# Patient Record
Sex: Female | Born: 1990 | Hispanic: Yes | Marital: Single | Smoking: Never smoker
Health system: Southern US, Community
[De-identification: ages and names within clinical notes are randomized; demographics above are authoritative.]

## PROBLEM LIST (undated history)

## (undated) DIAGNOSIS — N10 Acute tubulo-interstitial nephritis: Secondary | ICD-10-CM

## (undated) DIAGNOSIS — O139 Gestational [pregnancy-induced] hypertension without significant proteinuria, unspecified trimester: Secondary | ICD-10-CM

## (undated) HISTORY — PX: NO PAST SURGERIES: SHX2092

---

## 2009-08-20 ENCOUNTER — Ambulatory Visit (HOSPITAL_COMMUNITY): Admission: RE | Admit: 2009-08-20 | Discharge: 2009-08-20 | Payer: Self-pay | Admitting: Family Medicine

## 2010-04-25 NOTE — L&D Delivery Note (Signed)
.  Delivery Note At 10:23 AM a viable female was delivered via Vaginal, Spontaneous Delivery (Presentation: ; Occiput Anterior).  APGAR: 7, 9; weight 6 lb 3.8 oz (2830 g).   Placenta status: delivered intact , .  Cord: 3 vessels with the following complications: .  Cord pH: is pending.  Was a venous sample as an arterial sample could not be obtained.  Anesthesia: Epidural Local perineal with 1% lidocaine without epi Episiotomy: None Lacerations: 1st degree;Perineal Suture Repair: none Est. Blood Loss (mL):  Mom to AICU.  Baby to nursery-stable.  Cylan Borum 12/08/2010, 10:45 AM

## 2010-05-16 ENCOUNTER — Encounter: Payer: Self-pay | Admitting: Family Medicine

## 2010-07-25 ENCOUNTER — Inpatient Hospital Stay (HOSPITAL_COMMUNITY): Admission: AD | Admit: 2010-07-25 | Payer: Self-pay | Source: Ambulatory Visit | Admitting: Obstetrics & Gynecology

## 2010-07-26 ENCOUNTER — Emergency Department (HOSPITAL_COMMUNITY)
Admission: EM | Admit: 2010-07-26 | Discharge: 2010-07-26 | Disposition: A | Discharge status: Home / Self Care | Payer: Medicaid Other | Attending: Emergency Medicine | Admitting: Emergency Medicine

## 2010-07-26 ENCOUNTER — Emergency Department (HOSPITAL_COMMUNITY): Payer: Self-pay

## 2010-07-26 ENCOUNTER — Emergency Department (HOSPITAL_COMMUNITY): Payer: Medicaid Other

## 2010-07-26 DIAGNOSIS — R109 Unspecified abdominal pain: Secondary | ICD-10-CM | POA: Insufficient documentation

## 2010-07-26 DIAGNOSIS — N12 Tubulo-interstitial nephritis, not specified as acute or chronic: Secondary | ICD-10-CM | POA: Insufficient documentation

## 2010-07-26 DIAGNOSIS — R112 Nausea with vomiting, unspecified: Secondary | ICD-10-CM | POA: Insufficient documentation

## 2010-07-26 DIAGNOSIS — R509 Fever, unspecified: Secondary | ICD-10-CM | POA: Insufficient documentation

## 2010-07-26 DIAGNOSIS — N898 Other specified noninflammatory disorders of vagina: Secondary | ICD-10-CM | POA: Insufficient documentation

## 2010-07-26 DIAGNOSIS — O2390 Unspecified genitourinary tract infection in pregnancy, unspecified trimester: Secondary | ICD-10-CM | POA: Insufficient documentation

## 2010-07-26 LAB — URINALYSIS, ROUTINE W REFLEX MICROSCOPIC
Glucose, UA: NEGATIVE mg/dL
Specific Gravity, Urine: 1.016 (ref 1.005–1.030)
pH: 7 (ref 5.0–8.0)

## 2010-07-26 LAB — WET PREP, GENITAL: Trich, Wet Prep: NONE SEEN

## 2010-07-26 LAB — CBC
MCHC: 35.2 g/dL (ref 30.0–36.0)
MCV: 84.3 fL (ref 78.0–100.0)
Platelets: 370 10*3/uL (ref 150–400)
RDW: 12.2 % (ref 11.5–15.5)
WBC: 12.8 10*3/uL — ABNORMAL HIGH (ref 4.0–10.5)

## 2010-07-26 LAB — DIFFERENTIAL
Basophils Absolute: 0 10*3/uL (ref 0.0–0.1)
Eosinophils Absolute: 0.1 10*3/uL (ref 0.0–0.7)
Eosinophils Relative: 0 % (ref 0–5)

## 2010-07-26 LAB — URINE MICROSCOPIC-ADD ON

## 2010-07-27 LAB — URINE CULTURE
Colony Count: >=100000
Culture  Setup Time: 201204022254

## 2010-07-27 LAB — GC/CHLAMYDIA PROBE AMP, GENITAL: Chlamydia, DNA Probe: NEGATIVE

## 2010-08-11 ENCOUNTER — Other Ambulatory Visit: Payer: Self-pay | Admitting: Family Medicine

## 2010-08-11 DIAGNOSIS — Z3689 Encounter for other specified antenatal screening: Secondary | ICD-10-CM

## 2010-08-11 LAB — HEPATITIS B SURFACE ANTIGEN: Hepatitis B Surface Ag: NEGATIVE

## 2010-08-11 LAB — ANTIBODY SCREEN: Antibody Screen: NEGATIVE

## 2010-08-11 LAB — ABO/RH: RH Type: POSITIVE

## 2010-08-16 ENCOUNTER — Ambulatory Visit (HOSPITAL_COMMUNITY)
Admission: RE | Admit: 2010-08-16 | Discharge: 2010-08-16 | Disposition: A | Discharge status: Home / Self Care | Payer: Medicaid Other | Source: Ambulatory Visit | Attending: Family Medicine | Admitting: Family Medicine

## 2010-08-16 DIAGNOSIS — Z3689 Encounter for other specified antenatal screening: Secondary | ICD-10-CM

## 2010-08-16 DIAGNOSIS — O358XX0 Maternal care for other (suspected) fetal abnormality and damage, not applicable or unspecified: Secondary | ICD-10-CM | POA: Insufficient documentation

## 2010-08-16 DIAGNOSIS — Z363 Encounter for antenatal screening for malformations: Secondary | ICD-10-CM | POA: Insufficient documentation

## 2010-08-16 DIAGNOSIS — Z1389 Encounter for screening for other disorder: Secondary | ICD-10-CM | POA: Insufficient documentation

## 2010-10-21 ENCOUNTER — Inpatient Hospital Stay (HOSPITAL_COMMUNITY): Payer: BC Managed Care – PPO

## 2010-10-21 ENCOUNTER — Inpatient Hospital Stay (HOSPITAL_COMMUNITY)
Admission: AD | Admit: 2010-10-21 | Discharge: 2010-10-23 | DRG: 886 | Disposition: A | Discharge status: Home / Self Care | Payer: BC Managed Care – PPO | Source: Ambulatory Visit | Attending: Family Medicine | Admitting: Family Medicine

## 2010-10-21 DIAGNOSIS — O26839 Pregnancy related renal disease, unspecified trimester: Secondary | ICD-10-CM

## 2010-10-21 DIAGNOSIS — N1330 Unspecified hydronephrosis: Secondary | ICD-10-CM

## 2010-10-21 DIAGNOSIS — R109 Unspecified abdominal pain: Secondary | ICD-10-CM | POA: Diagnosis present

## 2010-10-21 LAB — URINALYSIS, ROUTINE W REFLEX MICROSCOPIC
Bilirubin Urine: NEGATIVE
Ketones, ur: NEGATIVE mg/dL
Nitrite: NEGATIVE
pH: 6.5 (ref 5.0–8.0)

## 2010-10-21 LAB — CBC
HCT: 31.3 % — ABNORMAL LOW (ref 36.0–46.0)
Hemoglobin: 10.8 g/dL — ABNORMAL LOW (ref 12.0–15.0)
MCHC: 34.5 g/dL (ref 30.0–36.0)
RBC: 3.61 MIL/uL — ABNORMAL LOW (ref 3.87–5.11)
WBC: 12.6 10*3/uL — ABNORMAL HIGH (ref 4.0–10.5)

## 2010-10-21 LAB — URINE MICROSCOPIC-ADD ON

## 2010-10-25 NOTE — Discharge Summary (Signed)
  Amanda Sharp, Amanda Sharp         ACCOUNT NO.:  [id number]  MEDICAL RECORD NO.:  [id number]  LOCATION:  9157                          FACILITY:  [facility]  PHYSICIAN:  Catalina Antigua, MD     DATE OF BIRTH:  1991/04/18  DATE OF ADMISSION:  10/21/2010 DATE OF DISCHARGE:  10/23/2010                              DISCHARGE SUMMARY   This is a 20 year old G1 who presents at 31 weeks in 5 days with a history of right flank pain.  On admission, her white count was 12.6.  Her vital signs were stable and afebrile.  She had a renal ultrasound which showed moderate right hydronephrosis and no right ureteral jets was visualized, raising concern for possible ureteral stone and obstruction.  Urology consult was obtained.  The patient was managed with IV antibiotics and pain medication.  On hospital day #1, Urology consult was obtained at which point the patient's pain is significantly improved.  Urology recommended to continue oral antibiotics and followup analgesics as needed and followup as an outpatient postpartum or if the pain persisted that the patient needed to have a double J stent placed.  On hospital #2, the patient reported complete resolution of her pain.  The patient continued to remain afebrile and did not require any pain medications for greater than 24-hour period.  Fetal status remained reassuring without her hospitalization.  The patient was found to be stable for discharge.  Prescription for 20 tablets of Percocet was given p.r.n. for pain.  Discharge instructions were also provided.  Fetal movement and preterm labor precautions were given.  The patient is to follow up for her routine prenatal care on July 9,  at the [facility] as scheduled. The patient was also instructed to return to the MAU if she develops fevers or her pain worsens.     Catalina Antigua, MD     PC/MEDQ  D:  10/23/2010  T:  10/23/2010  Job:  [id number]  Electronically Signed by Catalina Antigua  on  10/25/2010 02:30:03 PM

## 2010-11-18 NOTE — Consult Note (Signed)
  Amanda Sharp, Amanda Sharp         ACCOUNT NO.:  [id number]  MEDICAL RECORD NO.:  [id number]  LOCATION:  9157                          FACILITY:  [facility]  PHYSICIAN:  Danae Chen, M.D.  DATE OF BIRTH:  November 16, 1990  DATE OF CONSULTATION:  10/22/2010 DATE OF DISCHARGE:                                CONSULTATION   REASON FOR CONSULTATION:  Right flank pain and moderate hydronephrosis.  The patient is a 20 year old female who is 84 weeks' pregnant.  She had a sudden onset of severe right flank pain associated with nausea and vomiting.  A renal ultrasound showed moderate right hydronephrosis and no right ureteral jet within the bladder.  She was admitted and treated with analgesics.  This morning she feels a little bit better.  She does not have as much pain but she still has nausea.  She denies any previous history of kidney stone.  She is on nitrofurantoin for suppressive antibiotic therapy for urinary tract infection.  She voids well at this time.  She denies frequency, urgency, dysuria or hematuria.  PAST MEDICAL HISTORY:  Negative for diabetes, hypertension.  SOCIAL HISTORY:  She is married.  Does not smoke nor drink.  PAST SURGICAL HISTORY:  None.  FAMILY HISTORY:  Negative for diabetes, hypertension or kidney stone.  MEDICATIONS:  Nitrofurantoin daily.  ALLERGIES:  No known drug allergies.  REVIEW OF SYSTEMS:  As noted in the HPI and everything else is negative.  PHYSICAL EXAMINATION:  This is a well developed 20 years of female who is in no acute distress.  She is alert and oriented to time, place and person.  Her husband is present during the examination.  Her vital signs are stable.  Her skin is warm and dry.  Her head is normal.  She has pink conjunctive.  Ears, nose and throat are within normal limits.  Neck is supple.  No cervical adenopathy.  She has no labored breathing.  Her abdomen is distended.  She has mild right CVA tenderness.  Kidneys are not palpable.   Her bladder is not distended.  Extremities are within normal limits.  Renal ultrasound shows a moderate right hydronephrosis.  No left hydronephrosis.  Her hemoglobin is 10.8, hematocrit 31.3 and WBC 12.6.  Urinalysis shows 21-50 rbcs, 3-6 wbcs.  IMPRESSION:  Right hydronephrosis, possible right ureteral calculus.  SUGGESTIONS:  Continue IV antibiotics.  Continue analgesics as needed. Strain all urine.  Since the patient feels better this morning, we can treat her conservatively.  If she starts having severe pain and the pain is not relieved by analgesics, she will need double-J stent placement. However if she does not have any pain, she can be discharged home and she will be followed as an outpatient after delivery.     Danae Chen, M.D.     MN/MEDQ  D:  10/22/2010  T:  10/23/2010  Job:  [id number]  Electronically Signed by [name] M.D. on 11/18/2010 06:03:19 PM

## 2010-11-22 ENCOUNTER — Other Ambulatory Visit: Payer: Self-pay | Admitting: Family Medicine

## 2010-11-22 DIAGNOSIS — Z09 Encounter for follow-up examination after completed treatment for conditions other than malignant neoplasm: Secondary | ICD-10-CM

## 2010-11-22 DIAGNOSIS — N1330 Unspecified hydronephrosis: Secondary | ICD-10-CM

## 2010-11-24 ENCOUNTER — Ambulatory Visit (HOSPITAL_COMMUNITY)
Admission: RE | Admit: 2010-11-24 | Discharge: 2010-11-24 | Disposition: A | Discharge status: Home / Self Care | Payer: BC Managed Care – PPO | Source: Ambulatory Visit | Attending: Family Medicine | Admitting: Family Medicine

## 2010-11-24 DIAGNOSIS — Z09 Encounter for follow-up examination after completed treatment for conditions other than malignant neoplasm: Secondary | ICD-10-CM

## 2010-11-24 DIAGNOSIS — O26839 Pregnancy related renal disease, unspecified trimester: Secondary | ICD-10-CM | POA: Insufficient documentation

## 2010-11-24 DIAGNOSIS — N1330 Unspecified hydronephrosis: Secondary | ICD-10-CM | POA: Insufficient documentation

## 2010-11-29 LAB — STREP B DNA PROBE: GBS: NEGATIVE

## 2010-12-06 ENCOUNTER — Encounter (HOSPITAL_COMMUNITY): Payer: Self-pay | Admitting: *Deleted

## 2010-12-06 ENCOUNTER — Inpatient Hospital Stay (HOSPITAL_COMMUNITY)
Admission: AD | Admit: 2010-12-06 | Discharge: 2010-12-10 | DRG: 372 | Disposition: A | Discharge status: Home / Self Care | Payer: BC Managed Care – PPO | Source: Ambulatory Visit | Attending: Obstetrics & Gynecology | Admitting: Obstetrics & Gynecology

## 2010-12-06 DIAGNOSIS — D649 Anemia, unspecified: Secondary | ICD-10-CM | POA: Diagnosis not present

## 2010-12-06 DIAGNOSIS — O1490 Unspecified pre-eclampsia, unspecified trimester: Secondary | ICD-10-CM

## 2010-12-06 DIAGNOSIS — O1410 Severe pre-eclampsia, unspecified trimester: Secondary | ICD-10-CM | POA: Diagnosis present

## 2010-12-06 DIAGNOSIS — IMO0002 Reserved for concepts with insufficient information to code with codable children: Secondary | ICD-10-CM

## 2010-12-06 DIAGNOSIS — O9903 Anemia complicating the puerperium: Secondary | ICD-10-CM | POA: Diagnosis not present

## 2010-12-06 DIAGNOSIS — O1414 Severe pre-eclampsia complicating childbirth: Principal | ICD-10-CM | POA: Diagnosis present

## 2010-12-06 DIAGNOSIS — Z331 Pregnant state, incidental: Secondary | ICD-10-CM

## 2010-12-06 HISTORY — DX: Acute pyelonephritis: N10

## 2010-12-06 LAB — COMPREHENSIVE METABOLIC PANEL
CO2: 20 mEq/L (ref 19–32)
Calcium: 9.2 mg/dL (ref 8.4–10.5)
Chloride: 103 mEq/L (ref 96–112)
Creatinine, Ser: 0.78 mg/dL (ref 0.50–1.10)
GFR calc Af Amer: >60 mL/min (ref >60)
GFR calc non Af Amer: >60 mL/min (ref >60)
Glucose, Bld: 78 mg/dL (ref 70–99)
Total Bilirubin: 0.2 mg/dL — ABNORMAL LOW (ref 0.3–1.2)

## 2010-12-06 LAB — PROTEIN / CREATININE RATIO, URINE
Protein Creatinine Ratio: 10.45 — ABNORMAL HIGH (ref 0.00–0.15)
Total Protein, Urine: 492.8 mg/dL

## 2010-12-06 LAB — CBC
HCT: 36.8 % (ref 36.0–46.0)
Hemoglobin: 12.3 g/dL (ref 12.0–15.0)
MCH: 29.3 pg (ref 26.0–34.0)
MCV: 87.6 fL (ref 78.0–100.0)
RBC: 4.2 MIL/uL (ref 3.87–5.11)
WBC: 9.3 10*3/uL (ref 4.0–10.5)

## 2010-12-06 LAB — URINALYSIS, ROUTINE W REFLEX MICROSCOPIC
Bilirubin Urine: NEGATIVE
Ketones, ur: NEGATIVE mg/dL
Nitrite: NEGATIVE
Specific Gravity, Urine: 1.02 (ref 1.005–1.030)
Urobilinogen, UA: 0.2 mg/dL (ref 0.0–1.0)
pH: 7 (ref 5.0–8.0)

## 2010-12-06 LAB — URINE MICROSCOPIC-ADD ON

## 2010-12-06 MED ORDER — TERBUTALINE SULFATE 1 MG/ML IJ SOLN: 0.2500 mg | Freq: Once | INTRAMUSCULAR | Status: AC | PRN

## 2010-12-06 MED ORDER — CITRIC ACID-SODIUM CITRATE 334-500 MG/5ML PO SOLN: 30.0000 mL | ORAL | Status: DC | PRN

## 2010-12-06 MED ORDER — OXYTOCIN 20 UNITS IN LACTATED RINGERS INFUSION - SIMPLE: 125.0000 mL/h | INTRAVENOUS | Status: AC

## 2010-12-06 MED ORDER — MAGNESIUM SULFATE BOLUS VIA INFUSION
4.0000 g | Freq: Once | INTRAVENOUS | Status: DC
  Administered 2010-12-06: 4 g via INTRAVENOUS
  Filled 2010-12-06: qty 500

## 2010-12-06 MED ORDER — OXYTOCIN BOLUS FROM INFUSION
500.0000 mL | Freq: Once | INTRAVENOUS | Status: DC
  Filled 2010-12-06 (×2): qty 1000
  Filled 2010-12-06: qty 500

## 2010-12-06 MED ORDER — LIDOCAINE HCL (PF) 1 % IJ SOLN
30.0000 mL | INTRAMUSCULAR | Status: DC | PRN
  Filled 2010-12-06 (×2): qty 30

## 2010-12-06 MED ORDER — FLEET ENEMA 7-19 GM/118ML RE ENEM: 1.0000 | ENEMA | RECTAL | Status: DC | PRN

## 2010-12-06 MED ORDER — LACTATED RINGERS IV SOLN
INTRAVENOUS | Status: DC
  Administered 2010-12-06 – 2010-12-08 (×8): via INTRAVENOUS

## 2010-12-06 MED ORDER — OXYCODONE-ACETAMINOPHEN 5-325 MG PO TABS
2.0000 | ORAL_TABLET | ORAL | Status: DC | PRN
  Filled 2010-12-06: qty 2

## 2010-12-06 MED ORDER — ONDANSETRON HCL 4 MG/2ML IJ SOLN: 4.0000 mg | Freq: Four times a day (QID) | INTRAMUSCULAR | Status: DC | PRN

## 2010-12-06 MED ORDER — MAGNESIUM SULFATE 40 G IN LACTATED RINGERS - SIMPLE
1.0000 g/h | INTRAVENOUS | Status: DC
  Administered 2010-12-06 – 2010-12-07 (×3): 2 g/h via INTRAVENOUS
  Filled 2010-12-06 (×2): qty 500

## 2010-12-06 MED ORDER — MISOPROSTOL 25 MCG QUARTER TABLET
25.0000 ug | ORAL_TABLET | ORAL | Status: DC | PRN
  Administered 2010-12-06 – 2010-12-07 (×3): 25 ug via VAGINAL
  Filled 2010-12-06 (×3): qty 0.25
  Filled 2010-12-06: qty 1

## 2010-12-06 MED ORDER — ACETAMINOPHEN 325 MG PO TABS
650.0000 mg | ORAL_TABLET | ORAL | Status: DC | PRN
  Administered 2010-12-07 – 2010-12-08 (×2): 650 mg via ORAL
  Filled 2010-12-06 (×2): qty 2

## 2010-12-06 MED ORDER — LACTATED RINGERS IV SOLN
500.0000 mL | INTRAVENOUS | Status: DC | PRN
  Administered 2010-12-08: 400 mL via INTRAVENOUS
  Administered 2010-12-08: 200 mL via INTRAVENOUS

## 2010-12-06 MED ORDER — IBUPROFEN 600 MG PO TABS: 600.0000 mg | ORAL_TABLET | Freq: Four times a day (QID) | ORAL | Status: DC | PRN

## 2010-12-06 NOTE — Progress Notes (Signed)
Sent from  [facility] Dept with proteinuria, hypertension, no orders

## 2010-12-06 NOTE — Progress Notes (Signed)
Amanda Sharp is a 20 y.o. G1P0000 at [redacted]w[redacted]d admitted for induction of labor due to preeclampsia.  Subjective: Pt resting comfortably  Objective: BP 126/88  Pulse 80  Temp(Src) 98 F (36.7 C) (Oral)  Resp 18  Ht 4\' 9"  (1.448 m)  Wt 159 lb (72.122 kg)  BMI 34.41 kg/m2  SpO2 99% I/O last 3 completed shifts: In: 85.4 [I.V.:85.4] Out: -  I/O this shift: In: 545 [P.O.:270; I.V.:275] Out: 400 [Urine:400]  FHT:  FHR: 130 bpm, variability: moderate,  accelerations:  Present,  decelerations:  Absent UC:   irregular, SVE:   Dilation: 1.5 Effacement (%): 60 Station: -2 Exam by:: [email or web address]  Labs: Lab Results  Component Value Date   WBC 9.3 12/06/2010   HGB 12.3 12/06/2010   HCT 36.8 12/06/2010   MCV 87.6 12/06/2010   PLT 173 12/06/2010    Assessment / Plan: Induction of labor 2/2 preE, on magnesium  Labor: s/p cytotec @ 2000, second dose due at 0000 Fetal Wellbeing:  Category I Pain Control:  Labor support without medications I/D:  n/a Anticipated MOD:  NSVD  [name] MD 12/06/2010, 9:53 PM

## 2010-12-06 NOTE — H&P (Signed)
Amanda Sharp is a 20 y.o. female G1P0 with IUP at [redacted]w[redacted]d presenting for hypertension.   Pt was sent from the health department to the MAU for evaluation of blood pressure. Pt's BP at HD was 138/96. Her past BP's have ranged between 95 to 106 systolic, over 65-71 diastolic. She complains of a headache that started 2 hours before arriving to MAU and has since resolved. Relieved with tylenol and not associated with any change in vision. Reports a right mid abdominal discomfort that occurred on Sunday and has since resolved. Mentions some swelling in her lower extremities. She denies any contractions, any vaginal bleeding, any loss of fluid, any decreased fetal movement.  She complains of a rash that itches, present for 3 weeks, which was evaluated at health department and considered normal.  Her pregnancy has been complicated by pyelonephritis on 07/26/10 for which she is currently taking macrobid.   Prenatal History/Complications:  Past Medical History: Past Medical History  Diagnosis Date  . Pyelonephritis, acute     20 12 this pregnancy    Past Surgical History: No past surgical history on file.  Obstetrical History: OB History    Grav Para Term Preterm Abortions TAB SAB Ect Mult Living   1              Social History: History   Social History  . Marital Status: Married    Spouse Name: N/A    Number of Children: N/A  . Years of Education: N/A   Social History Main Topics  . Smoking status: Never Smoker   . Smokeless tobacco: Not on file  . Alcohol Use: No  . Drug Use: No  . Sexually Active: Yes   Other Topics Concern  . Not on file   Social History Narrative  . No narrative on file    Family History: No family history on file.  Allergies: No Known Allergies  Prescriptions prior to admission  Medication Sig Dispense Refill  . acetaminophen (TYLENOL) 325 MG tablet Take 235-650 mg by mouth every 6 (six) hours as needed. As needed for headache       .  prenatal vitamin w/FE, FA (PRENATAL 1 + 1) 27-1 MG TABS Take 1 tablet by mouth daily.          Review of Systems - Negative except per HPI   Blood pressure 123/83, pulse 66, temperature 98 F (36.7 C), temperature source Oral, resp. rate 20, height 4\' 9"  (1.448 m), weight 159 lb (72.122 kg), SpO2 99.00%. General appearance - alert, well appearing, and in no distress  Chest - clear to auscultation, no wheezes, rales or rhonchi, symmetric air entry  Heart - normal rate, regular rhythm, normal S1, S2, no murmurs, rubs, clicks or gallops  Abdomen - gravid, no epigastric or RUQ tenderness  Neurological - alert, oriented, normal speech, no focal findings or movement disorder noted, DTR's normal and symmetric, no clonus  Musculoskeletal - 2+ pitting edema up to mid calf  Baseline: 135 bpm, Variability: Good {> 6 bpm), Accelerations: Reactive and Decelerations: Absent  Contractions: None Dilation: Closed (PER T BURLESON NP ) Effacement (%): Thick   PrenatalA labs: ABO, Rh:A+   Antibody: neg   Rubella: imm   RPR: neg    HBsAg:neg    HIV: NR    GBS: neg    3 hr Glucola: passed Genetic screening: To late Anatomy US: Normal   Recent Results (from the past 24 hour(s))  PROTEIN / CREATININE RATIO, URINE  Collection Time   12/06/10  2:15 PM      Component Value Range   Creatinine, Urine 47.18     Total Protein, Urine 492.8     PROTEIN CREATININE RATIO 10.45 (*) 0.00 - 0.15   URINALYSIS, ROUTINE W REFLEX MICROSCOPIC   Collection Time   12/06/10  2:15 PM      Component Value Range   Color, Urine YELLOW  YELLOW    Appearance CLEAR  CLEAR    Specific Gravity, Urine 1.020  1.005 - 1.030    pH 7.0  5.0 - 8.0    Glucose, UA NEGATIVE  NEGATIVE (mg/dL)   Hgb urine dipstick TRACE (*) NEGATIVE    Bilirubin Urine NEGATIVE  NEGATIVE    Ketones, ur NEGATIVE  NEGATIVE (mg/dL)   Protein, ur >960 (*) NEGATIVE (mg/dL)   Urobilinogen, UA 0.2  0.0 - 1.0 (mg/dL)   Nitrite NEGATIVE  NEGATIVE     Leukocytes, UA NEGATIVE  NEGATIVE   URINE MICROSCOPIC-ADD ON   Collection Time   12/06/10  2:15 PM      Component Value Range   Squamous Epithelial / LPF FEW (*) RARE    WBC, UA 0-2  <3 (WBC/hpf)   RBC / HPF 0-2  <3 (RBC/hpf)   Bacteria, UA RARE  RARE   CBC   Collection Time   12/06/10  3:09 PM      Component Value Range   WBC 9.3  4.0 - 10.5 (K/uL)   RBC 4.20  3.87 - 5.11 (MIL/uL)   Hemoglobin 12.3  12.0 - 15.0 (g/dL)   HCT 45.4  09.8 - 11.9 (%)   MCV 87.6  78.0 - 100.0 (fL)   MCH 29.3  26.0 - 34.0 (pg)   MCHC 33.4  30.0 - 36.0 (g/dL)   RDW 14.7 (*) 82.9 - 15.5 (%)   Platelets 173  150 - 400 (K/uL)  COMPREHENSIVE METABOLIC PANEL   Collection Time   12/06/10  3:09 PM      Component Value Range   Sodium 136  135 - 145 (mEq/L)   Potassium 4.0  3.5 - 5.1 (mEq/L)   Chloride 103  96 - 112 (mEq/L)   CO2 20  19 - 32 (mEq/L)   Glucose, Bld 78  70 - 99 (mg/dL)   BUN 21  6 - 23 (mg/dL)   Creatinine, Ser 5.62  0.50 - 1.10 (mg/dL)   Calcium 9.2  8.4 - 13.0 (mg/dL)   Total Protein 6.1  6.0 - 8.3 (g/dL)   Albumin 2.6 (*) 3.5 - 5.2 (g/dL)   AST 50 (*) 0 - 37 (U/L)   ALT 87 (*) 0 - 35 (U/L)   Alkaline Phosphatase 207 (*) 39 - 117 (U/L)   Total Bilirubin 0.2 (*) 0.3 - 1.2 (mg/dL)   GFR calc non Af Amer >60  >60 (mL/min)   GFR calc Af Amer >60  >60 (mL/min)    Assessment/Plan: 20yo G1 @38 .3 with preeclampsia. 1. Admit to L+D for induction with cytotec 2. Magnesium 3. Monitor BP closely 4. Q6H CMET/CBC to monitor for further progression of disorder

## 2010-12-06 NOTE — H&P (Signed)
Amanda Sharp is a 20 y.o. female G1P0 with IUP at [redacted]w[redacted]d presenting for elevated blood pressure and proteinuria. Pt states she has been having no contractions, associated with none vaginal bleeding, intact, with active.  She desires to no method.  Plans on breast and bottle feeding PNCare at HD  since 21.5 wks  Prenatal History/Complications: LTC Pyelonephritis treated during this pregnancy Anemia BV  Past Medical History: Past Medical History  Diagnosis Date  . Pyelonephritis, acute     2012 this pregnancy    Past Surgical History: No past surgical history on file.  Obstetrical History: OB History    Grav Para Term Preterm Abortions TAB SAB Ect Mult Living   1               Gynecological History: No STis or HSV  Social History: History   Social History  . Marital Status: Married    Spouse Name: N/A    Number of Children: N/A  . Years of Education: N/A   Social History Main Topics  . Smoking status: Never Smoker   . Smokeless tobacco: Not on file  . Alcohol Use: No  . Drug Use: No  . Sexually Active: Yes   Other Topics Concern  . Not on file   Social History Narrative  . No narrative on file    Family History: No family history on file.  Allergies: No Known Allergies  Prescriptions prior to admission  Medication Sig Dispense Refill  . acetaminophen (TYLENOL) 325 MG tablet Take 235-650 mg by mouth every 6 (six) hours as needed. As needed for headache       . prenatal vitamin w/FE, FA (PRENATAL 1 + 1) 27-1 MG TABS Take 1 tablet by mouth daily.          Review of Systems - Negative except per HPI, no headaches, no vision changes, or RUQ pain, but does note swelling of hands, face, feet for 3 days.   Blood pressure 123/83, pulse 66, temperature 98 F (36.7 C), temperature source Oral, resp. rate 20, height 4\' 9"  (1.448 m), weight 72.122 kg (159 lb), SpO2 99.00%. General appearance: alert and cooperative Lungs: clear to auscultation  bilaterally Heart: regular rate and rhythm, S1, S2 normal, no murmur, click, rub or gallop Abdomen: soft, non-tender; bowel sounds normal; no masses,  no organomegaly and gravid, size cwd, EFW 6.8 lbs-7 lbs by leopolds, Vertex by leopolds. Extremities: edema 1+ bilaterally Neurologic: Alert and oriented X 3, normal strength and tone. Normal symmetric reflexes. Normal coordination and gait cephalic Baseline: 135-140 bpm, Variability: Good {> 6 bpm), Accelerations: Reactive and Decelerations: Absent None Dilation: 1 Effacement (%): 50 Station: -1 Exam by:: Dr. Orvan Falconer   Prenatal labs: ABO, Rh:A pos   Antibody:neg   Rubella:immune   RPR:NR    HBsAg:NR    HIV:NR    GBS: neg    1 hr Glucola 135-3 hr-77/129/97/98 Genetic screening  Anatomy US normal  Current labs:  Results for orders placed during the hospital encounter of 12/06/10 (from the past 24 hour(s))  PROTEIN / CREATININE RATIO, URINE     Status: Abnormal   Collection Time   12/06/10  2:15 PM      Component Value Range   Creatinine, Urine 47.18     Total Protein, Urine 492.8     PROTEIN CREATININE RATIO 10.45 (*) 0.00 - 0.15   URINALYSIS, ROUTINE W REFLEX MICROSCOPIC     Status: Abnormal   Collection Time   12/06/10  2:15 PM      Component Value Range   Color, Urine YELLOW  YELLOW    Appearance CLEAR  CLEAR    Specific Gravity, Urine 1.020  1.005 - 1.030    pH 7.0  5.0 - 8.0    Glucose, UA NEGATIVE  NEGATIVE (mg/dL)   Hgb urine dipstick TRACE (*) NEGATIVE    Bilirubin Urine NEGATIVE  NEGATIVE    Ketones, ur NEGATIVE  NEGATIVE (mg/dL)   Protein, ur >119 (*) NEGATIVE (mg/dL)   Urobilinogen, UA 0.2  0.0 - 1.0 (mg/dL)   Nitrite NEGATIVE  NEGATIVE    Leukocytes, UA NEGATIVE  NEGATIVE   URINE MICROSCOPIC-ADD ON     Status: Abnormal   Collection Time   12/06/10  2:15 PM      Component Value Range   Squamous Epithelial / LPF FEW (*) RARE    WBC, UA 0-2  <3 (WBC/hpf)   RBC / HPF 0-2  <3 (RBC/hpf)   Bacteria, UA RARE   RARE   CBC     Status: Abnormal   Collection Time   12/06/10  3:09 PM      Component Value Range   WBC 9.3  4.0 - 10.5 (K/uL)   RBC 4.20  3.87 - 5.11 (MIL/uL)   Hemoglobin 12.3  12.0 - 15.0 (g/dL)   HCT 14.7  82.9 - 56.2 (%)   MCV 87.6  78.0 - 100.0 (fL)   MCH 29.3  26.0 - 34.0 (pg)   MCHC 33.4  30.0 - 36.0 (g/dL)   RDW 13.0 (*) 86.5 - 15.5 (%)   Platelets 173  150 - 400 (K/uL)  COMPREHENSIVE METABOLIC PANEL     Status: Abnormal   Collection Time   12/06/10  3:09 PM      Component Value Range   Sodium 136  135 - 145 (mEq/L)   Potassium 4.0  3.5 - 5.1 (mEq/L)   Chloride 103  96 - 112 (mEq/L)   CO2 20  19 - 32 (mEq/L)   Glucose, Bld 78  70 - 99 (mg/dL)   BUN 21  6 - 23 (mg/dL)   Creatinine, Ser 7.84  0.50 - 1.10 (mg/dL)   Calcium 9.2  8.4 - 69.6 (mg/dL)   Total Protein 6.1  6.0 - 8.3 (g/dL)   Albumin 2.6 (*) 3.5 - 5.2 (g/dL)   AST 50 (*) 0 - 37 (U/L)   ALT 87 (*) 0 - 35 (U/L)   Alkaline Phosphatase 207 (*) 39 - 117 (U/L)   Total Bilirubin 0.2 (*) 0.3 - 1.2 (mg/dL)   GFR calc non Af Amer >60  >60 (mL/min)   GFR calc Af Amer >60  >60 (mL/min)    Assessment: Andee Lissette Quintasha Gren is a 20 y.o. G1P0 with an IUP at [redacted]w[redacted]d presenting for severe preeclampsia  Plan: 1. Admit to labor and delivery for induction of labor 2. Mag for sz ppx 3. Serial labs.   Claryce Friel 12/06/2010, 5:52 PM

## 2010-12-06 NOTE — ED Provider Notes (Signed)
Chief Complaint:  Hypertension   Amanda Sharp is  20 y.o. G1P0 at [redacted]w[redacted]d presents complaining of Hypertension Pt was sent from the health department to the MAU for evaluation of blood pressure. Pt's BP at HD was 138/96. Her past BP's have ranged between 95 to 106 systolic, over 65-71 diastolic. She complains of a headache that started 2 hours before arriving to MAU and has since resolved. Relieved with tylenol and not associated with any change in vision. Reports a right mid abdominal discomfort that occurred on Sunday and has since resolved. Mentions some swelling in her lower extremities. She denies any contractions, any vaginal bleeding, any loss of fluid, any decreased fetal movement.  She complains of a rash that itches, present for 3 weeks, which was evaluated at health department and considered normal.  Her pregnancy has been complicated by pyelonephritis on 07/26/10 for which she is currently taking macrobid.  Obstetrical/Gynecological History: OB History    Grav Para Term Preterm Abortions TAB SAB Ect Mult Living   1               Past Medical History: Past Medical History  Diagnosis Date  . Pyelonephritis, acute     20 12 this pregnancy    Past Surgical History: No past surgical history on file.  Family History: No family history on file.  Social History: History  Substance Use Topics  . Smoking status: Never Smoker   . Smokeless tobacco: Not on file  . Alcohol Use: No    Allergies: Allergies not on file  Medications: Macrobid 100mg  qd  Review of Systems - Negative except per HPI  Physical Exam   Blood pressure 132/96, pulse 68, temperature 98 F (36.7 C), temperature source Oral, resp. rate 20, height 4\' 9"  (1.448 m), weight 159 lb (72.122 kg), SpO2 99.00%.  General: General appearance - alert, well appearing, and in no distress Chest - clear to auscultation, no wheezes, rales or rhonchi, symmetric air entry Heart - normal rate, regular rhythm,  normal S1, S2, no murmurs, rubs, clicks or gallops Abdomen - gravid, no epigastric or RUQ tenderness Neurological - alert, oriented, normal speech, no focal findings or movement disorder noted, DTR's normal and symmetric, no clonus Musculoskeletal - 2+ pitting edema up to mid calf Baseline: 135 bpm, Variability: Good {> 6 bpm), Accelerations: Reactive and Decelerations: Absent none   Labs: Recent Results (from the past 24 hour(s))  CBC   Collection Time   12/06/10  3:09 PM      Component Value Range   WBC 9.3  4.0 - 10.5 (K/uL)   RBC 4.20  3.87 - 5.11 (MIL/uL)   Hemoglobin 12.3  12.0 - 15.0 (g/dL)   HCT 95.2  84.1 - 32.4 (%)   MCV 87.6  78.0 - 100.0 (fL)   MCH 29.3  26.0 - 34.0 (pg)   MCHC 33.4  30.0 - 36.0 (g/dL)   RDW 40.1 (*) 02.7 - 15.5 (%)   Platelets 173  150 - 400 (K/uL)   CMP: Na: 136, K: 4.0, Cl: 103, HCO3: 20, BUN: 21, Cr: 0.78, glucose: 78, Ca: 9.2 Protein: 6.1, Alb: 2.6, T-bili: 0.2, AST: 50, ALT: 87, AlkPh: 207 Urine Pr/Cr: 10.45 Imaging Studies:  US Renal  11/24/2010  *RADIOLOGY REPORT*  Clinical Data: [redacted] weeks pregnant.  Follow-up hydronephrosis.  RENAL/URINARY TRACT ULTRASOUND COMPLETE  Comparison:  Renal ultrasound 10/21/2010  Findings:  Right Kidney:  Right kidney measures 11.3 cm in length.  Normal in contour and echogenicity.  Hydronephrosis of the right kidney has significantly decreased compared to the ultrasound of 10/21/2010. Currently, hydronephrosis is minimal, and consistent with a third trimester pregnancy.  No renal mass is identified.  Left Kidney:  Left kidney measures 10.8 cm in length and is normal in echogenicity and contour.  There is no hydronephrosis.  No renal mass is identified.  Bladder:  Urinary bladder is within normal limits for the degree of distention.  Bilateral ureteral jets are seen.  Gravid uterus incidentally noted.  IMPRESSION:  1.  Significant improvement in right-sided hydronephrosis compared to the ultrasound of 10/21/2010.  Currently,  hydronephrosis is minimal, and likely related to pregnancy. 2.  Normal left kidney.  Original Report Authenticated By: Britta Mccreedy, M.D.     Assessment: Amanda Sharp is  20 y.o. G1P0 at [redacted]w[redacted]d presents with preeclampsia  Plan: 1. Admit to L&D for induction  Punam Broussard, STEPHANIE8/13/20123:42 PM

## 2010-12-07 ENCOUNTER — Encounter (HOSPITAL_COMMUNITY): Payer: Self-pay | Admitting: Anesthesiology

## 2010-12-07 ENCOUNTER — Inpatient Hospital Stay (HOSPITAL_COMMUNITY): Payer: BC Managed Care – PPO | Admitting: Anesthesiology

## 2010-12-07 DIAGNOSIS — O1410 Severe pre-eclampsia, unspecified trimester: Secondary | ICD-10-CM | POA: Diagnosis present

## 2010-12-07 LAB — COMPREHENSIVE METABOLIC PANEL
ALT: 95 U/L — ABNORMAL HIGH (ref 0–35)
ALT: 109 U/L — ABNORMAL HIGH (ref 0–35)
ALT: 118 U/L — ABNORMAL HIGH (ref 0–35)
AST: 62 U/L — ABNORMAL HIGH (ref 0–37)
AST: 76 U/L — ABNORMAL HIGH (ref 0–37)
Albumin: 2.6 g/dL — ABNORMAL LOW (ref 3.5–5.2)
Alkaline Phosphatase: 214 U/L — ABNORMAL HIGH (ref 39–117)
Alkaline Phosphatase: 221 U/L — ABNORMAL HIGH (ref 39–117)
BUN: 18 mg/dL (ref 6–23)
BUN: 18 mg/dL (ref 6–23)
CO2: 18 mEq/L — ABNORMAL LOW (ref 19–32)
CO2: 19 mEq/L (ref 19–32)
CO2: 19 mEq/L (ref 19–32)
Calcium: 7.7 mg/dL — ABNORMAL LOW (ref 8.4–10.5)
Calcium: 9.2 mg/dL (ref 8.4–10.5)
Chloride: 101 mEq/L (ref 96–112)
Chloride: 105 mEq/L (ref 96–112)
Creatinine, Ser: 0.87 mg/dL (ref 0.50–1.10)
Creatinine, Ser: 0.89 mg/dL (ref 0.50–1.10)
Creatinine, Ser: 0.9 mg/dL (ref 0.50–1.10)
GFR calc Af Amer: >60 mL/min (ref >60)
GFR calc non Af Amer: >60 mL/min (ref >60)
GFR calc non Af Amer: >60 mL/min (ref >60)
GFR calc non Af Amer: >60 mL/min (ref >60)
Glucose, Bld: 81 mg/dL (ref 70–99)
Potassium: 4 mEq/L (ref 3.5–5.1)
Sodium: 136 mEq/L (ref 135–145)
Sodium: 131 mEq/L — ABNORMAL LOW (ref 135–145)
Sodium: 131 mEq/L — ABNORMAL LOW (ref 135–145)
Total Bilirubin: 0.2 mg/dL — ABNORMAL LOW (ref 0.3–1.2)
Total Bilirubin: 0.2 mg/dL — ABNORMAL LOW (ref 0.3–1.2)
Total Protein: 6.5 g/dL (ref 6.0–8.3)

## 2010-12-07 LAB — CBC
Hemoglobin: 12.6 g/dL (ref 12.0–15.0)
MCHC: 33.5 g/dL (ref 30.0–36.0)
MCV: 88.4 fL (ref 78.0–100.0)
Platelets: 182 10*3/uL (ref 150–400)
RBC: 4.41 MIL/uL (ref 3.87–5.11)
WBC: 14.2 10*3/uL — ABNORMAL HIGH (ref 4.0–10.5)

## 2010-12-07 LAB — RPR: RPR Ser Ql: NONREACTIVE

## 2010-12-07 MED ORDER — LIDOCAINE HCL 1.5 % IJ SOLN
INTRAMUSCULAR | Status: DC | PRN
  Administered 2010-12-07 (×2): 5 mL via EPIDURAL

## 2010-12-07 MED ORDER — DIPHENHYDRAMINE HCL 50 MG/ML IJ SOLN: 12.5000 mg | INTRAMUSCULAR | Status: DC | PRN

## 2010-12-07 MED ORDER — EPHEDRINE 5 MG/ML INJ
10.0000 mg | INTRAVENOUS | Status: DC | PRN
  Filled 2010-12-07: qty 4

## 2010-12-07 MED ORDER — OXYCODONE-ACETAMINOPHEN 5-325 MG PO TABS
2.0000 | ORAL_TABLET | Freq: Once | ORAL | Status: AC
  Administered 2010-12-07: 2 via ORAL

## 2010-12-07 MED ORDER — FENTANYL 2.5 MCG/ML BUPIVACAINE 1/10 % EPIDURAL INFUSION (WH - ANES)
14.0000 mL/h | INTRAMUSCULAR | Status: DC
  Administered 2010-12-07 – 2010-12-08 (×4): 14 mL/h via EPIDURAL
  Filled 2010-12-07 (×5): qty 60

## 2010-12-07 MED ORDER — LACTATED RINGERS IV SOLN
500.0000 mL | Freq: Once | INTRAVENOUS | Status: AC
  Administered 2010-12-07: 500 mL via INTRAVENOUS

## 2010-12-07 MED ORDER — PHENYLEPHRINE 40 MCG/ML (10ML) SYRINGE FOR IV PUSH (FOR BLOOD PRESSURE SUPPORT)
80.0000 ug | PREFILLED_SYRINGE | INTRAVENOUS | Status: DC | PRN
  Filled 2010-12-07 (×2): qty 5

## 2010-12-07 MED ORDER — OXYTOCIN 20 UNITS IN LACTATED RINGERS INFUSION - SIMPLE
1.0000 m[IU]/min | INTRAVENOUS | Status: DC
  Administered 2010-12-07: 1 m[IU]/min via INTRAVENOUS
  Administered 2010-12-07: 13 m[IU]/min via INTRAVENOUS
  Administered 2010-12-08: 30 m[IU]/min via INTRAVENOUS
  Administered 2010-12-08: 26 m[IU]/min via INTRAVENOUS
  Administered 2010-12-08: 24 m[IU]/min via INTRAVENOUS
  Administered 2010-12-08: 20 m[IU]/min via INTRAVENOUS
  Administered 2010-12-08: 41.667 m[IU]/min via INTRAVENOUS
  Administered 2010-12-08: 22 m[IU]/min via INTRAVENOUS
  Administered 2010-12-08: 333 m[IU]/min via INTRAVENOUS
  Administered 2010-12-08: 22 m[IU]/min via INTRAVENOUS
  Filled 2010-12-07: qty 1000

## 2010-12-07 MED ORDER — PHENYLEPHRINE 40 MCG/ML (10ML) SYRINGE FOR IV PUSH (FOR BLOOD PRESSURE SUPPORT)
80.0000 ug | PREFILLED_SYRINGE | INTRAVENOUS | Status: DC | PRN
  Filled 2010-12-07: qty 5

## 2010-12-07 MED ORDER — EPHEDRINE 5 MG/ML INJ
10.0000 mg | INTRAVENOUS | Status: DC | PRN
  Filled 2010-12-07 (×2): qty 4

## 2010-12-07 NOTE — Anesthesia Preprocedure Evaluation (Deleted)
Anesthesia Evaluation  Name, MR# and DOB Patient awake  General Assessment Comment  Reviewed: Allergy & Precautions, H&P , Patient's Chart, lab work & pertinent test results  Airway Mallampati: III TM Distance: >3 FB Neck ROM: full    Dental No notable dental hx.    Pulmonary  clear to auscultation  pulmonary exam normalPulmonary Exam Normal breath sounds clear to auscultation none    Cardiovascular hypertension, regular Normal    Neuro/Psych Negative Neurological ROS  Negative Psych ROS  GI/Hepatic/Renal negative GI ROS, negative Liver ROS, and negative Renal ROS (+)       Endo/Other  Negative Endocrine ROS (+)      Abdominal   Musculoskeletal   Hematology negative hematology ROS (+)   Peds  Reproductive/Obstetrics (+) Pregnancy    Anesthesia Other Findings HELLP/severe preeclampsia           Anesthesia Physical Anesthesia Plan  ASA: III  Anesthesia Plan: Epidural   Post-op Pain Management:    Induction:   Airway Management Planned:   Additional Equipment:   Intra-op Plan:   Post-operative Plan:   Informed Consent: I have reviewed the patients History and Physical, chart, labs and discussed the procedure including the risks, benefits and alternatives for the proposed anesthesia with the patient or authorized representative who has indicated his/her understanding and acceptance.     Plan Discussed with:   Anesthesia Plan Comments:         Anesthesia Quick Evaluation

## 2010-12-07 NOTE — Progress Notes (Addendum)
Amanda Sharp is a 20 y.o. G1P0000 at [redacted]w[redacted]d admitted for IOL due to preeclampsia  Subjective: Pt feeling sleepy, some discomfort with contractions.   Objective: BP 117/76  Pulse 77  Temp(Src) 97.9 F (36.6 C) (Oral)  Resp 20  Ht 4\' 9"  (1.448 m)  Wt 159 lb (72.122 kg)  BMI 34.41 kg/m2  SpO2 99% I/O last 3 completed shifts: In: 3247.5 [P.O.:1710; I.V.:1537.5] Out: 3650 [Urine:3650] I/O this shift: In: 1699.5 [P.O.:900; I.V.:799.5] Out: 550 [Urine:550]  FHT:  FHR: 130 bpm, variability: moderate,  accelerations:  Abscent,  decelerations:  Absent UC:   irregular, every 3-4 minutes SVE:   Dilation: 2 Effacement (%): 50 Station: -1 Exam by:: Amanda Sharp  Labs: Lab Results  Component Value Date   WBC 9.3 12/06/2010   HGB 12.3 12/06/2010   HCT 36.8 12/06/2010   MCV 87.6 12/06/2010   PLT 173 12/06/2010    Assessment / Plan: 20 yo G1 at 38.3wga. IOL due to seever preeclampsia. on pitocin 40mu/min, s/p cytotecx3. s/p SROM  Labor: on Pitocin. Continue titrating. Not regular pattern of contractions yet. Will continue pit and reevaluate for possible foley. Preeclampsia:  on magnesium sulfate, no signs of mag toxicity Fetal Wellbeing:  Category II, most likely due to mag Pain Control:  Labor support without medications. Pt will want epidural I/D:  n/a Anticipated MOD:  NSVD  Amanda Sharp 12/07/2010, 1:46 PM   Subjective:    [name], female    DOB: [birth year], 20 y.o.   MRN: [id number]  Hypertension      Review of Systems     Objective:   Physical Exam        Assessment & Plan:

## 2010-12-07 NOTE — Anesthesia Procedure Notes (Signed)

## 2010-12-07 NOTE — Progress Notes (Signed)
Amanda Sharp is a 20 y.o. G1P0000 at [redacted]w[redacted]d admitted for induction of labor due to preeclampsia.  Subjective: Pt resting comfortably.  3rd cytotec placed by RN @ 0500  Objective: BP 117/84  Pulse 82  Temp(Src) 98.1 F (36.7 C) (Oral)  Resp 20  Ht 4\' 9"  (1.448 m)  Wt 159 lb (72.122 kg)  BMI 34.41 kg/m2  SpO2 99% I/O last 3 completed shifts: In: 85.4 [I.V.:85.4] Out: -  I/O this shift: In: 2672.1 [P.O.:1470; I.V.:1202.1] Out: 3125 [Urine:3125]  FHT:  FHR: 150 bpm, variability: moderate,  accelerations:  Present,  decelerations:  Absent UC:   irregular,  SVE:   Dilation: 1.5 Effacement (%): 60 Station: -1 Exam by:: e.foley,rn  Labs: Lab Results  Component Value Date   WBC 9.3 12/06/2010   HGB 12.3 12/06/2010   HCT 36.8 12/06/2010   MCV 87.6 12/06/2010   PLT 173 12/06/2010    Assessment / Plan: Induction of labor 2/2 preeclampsia  Labor: 3rd dose of cytotec placed @ 0500, will consider foley bulb Fetal Wellbeing:  Category I Pain Control:  Labor support without medications I/D:  n/a Anticipated MOD:  NSVD  [name] MD 12/07/2010, 5:36 AM

## 2010-12-07 NOTE — Progress Notes (Signed)
Amanda Sharp is a 20 y.o. G1P0000 at [redacted]w[redacted]d admitted for induction of labor due to preeclampsia  Subjective: Pt comfortable but sleepy  Objective: BP 129/87  Pulse 90  Temp(Src) 97.9 F (36.6 C) (Oral)  Resp 20  Ht 4\' 9"  (1.448 m)  Wt 159 lb (72.122 kg)  BMI 34.41 kg/m2  SpO2 99% I/O last 3 completed shifts: In: 3247.5 [P.O.:1710; I.V.:1537.5] Out: 3650 [Urine:3650] I/O this shift: In: 1089.5 [P.O.:660; I.V.:429.5] Out: 450 [Urine:450]  FHT: 125, moderate variability, no accels, no decels UC:   irregular, every 3-7 minutes SVE:   Dilation: 1.5 Effacement (%): 80 Station: -1 Exam by:: RIch  Labs: Lab Results  Component Value Date   WBC 9.3 12/06/2010   HGB 12.3 12/06/2010   HCT 36.8 12/06/2010   MCV 87.6 12/06/2010   PLT 173 12/06/2010    Assessment / Plan: Induction of labor due to preeclampsia. S/p 3 doses of cytotec and SROM  Labor: start pitocin Preeclampsia:  on magnesium sulfate Fetal Wellbeing:  Category II Pain Control:  Labor support without medications I/D:  n/a Anticipated MOD:  NSVD  Jalexis Breed 12/07/2010, 10:35 AM      Subjective:    [name], female    DOB: 02-13-1991, 20 y.o.   MRN: [id number]  Hypertension      Review of Systems     Objective:   Physical Exam        Assessment & Plan:

## 2010-12-07 NOTE — Anesthesia Preprocedure Evaluation (Signed)
Anesthesia Evaluation  Name, MR# and DOB Patient awake  General Assessment Comment  Reviewed: Allergy & Precautions, H&P , Patient's Chart, lab work & pertinent test results  Airway Mallampati: IV TM Distance: >3 FB Neck ROM: full    Dental No notable dental hx.    Pulmonary  clear to auscultation  pulmonary exam normalPulmonary Exam Normal breath sounds clear to auscultation none    Cardiovascular hypertension, regular Normal    Neuro/Psych Negative Neurological ROS  Negative Psych ROS  GI/Hepatic/Renal negative GI ROS, negative Liver ROS, and negative Renal ROS (+)       Endo/Other  Negative Endocrine ROS (+)      Abdominal   Musculoskeletal   Hematology negative hematology ROS (+)   Peds  Reproductive/Obstetrics (+) Pregnancy    Anesthesia Other Findings Severe preeclampsia  Platelets normal             Anesthesia Physical Anesthesia Plan  ASA: III  Anesthesia Plan: Epidural   Post-op Pain Management:    Induction:   Airway Management Planned:   Additional Equipment:   Intra-op Plan:   Post-operative Plan:   Informed Consent: I have reviewed the patients History and Physical, chart, labs and discussed the procedure including the risks, benefits and alternatives for the proposed anesthesia with the patient or authorized representative who has indicated his/her understanding and acceptance.     Plan Discussed with:   Anesthesia Plan Comments:         Anesthesia Quick Evaluation

## 2010-12-07 NOTE — Progress Notes (Signed)
Amanda Sharp is a 20 y.o. G1P0000 at [redacted]w[redacted]d admitted for induction of labor due to preeclampsia  Subjective: Pt feeling pressure and more pain with contractions.  Objective: BP 142/90  Pulse 86  Temp(Src) 97.4 F (36.3 C) (Oral)  Resp 20  Ht 4\' 9"  (1.448 m)  Wt 159 lb (72.122 kg)  BMI 34.41 kg/m2  SpO2 99% I/O last 3 completed shifts: In: 3247.5 [P.O.:1710; I.V.:1537.5] Out: 3650 [Urine:3650] I/O this shift: In: 2450.2 [P.O.:1140; I.V.:1310.2] Out: 750 [Urine:750]  FHT:  FHR: 130 bpm, variability: moderate,  accelerations:  Abscent,  decelerations:  Present earlies UC:   Regular, every 4 min SVE:   Dilation: 5.5 Effacement (%): 90 Station: 0 Exam by:: S.Frannie Shedrick  Labs: Lab Results  Component Value Date   WBC 14.2* 12/07/2010   HGB 13.2 12/07/2010   HCT 39.0 12/07/2010   MCV 88.4 12/07/2010   PLT 182 12/07/2010  Mag level: 8.4  Assessment / Plan: Induction of labor due to preeclampsia,  progressing well on pitocin  Labor: progressing on pitocin Preeclampsia:  on magnesium sulfate. Given high mag level, magnesium was decreased to 1gm q2hrs. Recheck mag level 2hours after decreasing dose. Fetal Wellbeing:  Category II Pain Control:  Epidural. Pt to receive epidural I/D:  GBS neg Anticipated MOD:  NSVD  Alastor Kneale 12/07/2010, 6:09 PM   Subjective:    [name], female    DOB: Jul 15, 1990, 20 y.o.   MRN: [id number]  Hypertension      Review of Systems     Objective:   Physical Exam        Assessment & Plan:

## 2010-12-07 NOTE — Progress Notes (Signed)
Amanda Sharp is a 20 y.o. G1P0000 at [redacted]w[redacted]d admitted for induction of labor due to preeclampsia.  Subjective: Pt repeat Mag level 8.6.  No signs/symptoms of mag tox.  Comfortable with epidural  Objective: BP 125/80  Pulse 94  Temp(Src) 97.4 F (36.3 C) (Oral)  Resp 17  Ht 4\' 9"  (1.448 m)  Wt 159 lb (72.122 kg)  BMI 34.41 kg/m2  SpO2 96% I/O last 3 completed shifts: In: 6790.4 [P.O.:2850; I.V.:3940.4] Out: 4675 [Urine:4675] I/O this shift: In: 180.1 [I.V.:180.1] Out: -   FHT:  FHR: 130 bpm, variability: moderate,  accelerations:  Present,  decelerations:  Present variables UC:   Every 3-72mins SVE:   Dilation: 6.5 Effacement (%): 90 Station: 0 (caput) Exam by:: Erasmo Downer RN  Labs: Lab Results  Component Value Date   WBC 14.2* 12/07/2010   HGB 13.2 12/07/2010   HCT 39.0 12/07/2010   MCV 88.4 12/07/2010   PLT 182 12/07/2010    Assessment / Plan: Induction of labor 2/2 Pre-e.    Labor: on pitocin Fetal Wellbeing:  Category II Pain Control:  Epidural I/D:  n/a Anticipated MOD:  NSVD Preeclampsia - BP wnl, elevated Mag level, will D/C mag for now  [name] MD 12/07/2010, 9:29 PM

## 2010-12-07 NOTE — Progress Notes (Signed)
Amanda Sharp is a 20 y.o. G1P0000 at [redacted]w[redacted]d, admitted for severe pre-eclampsia  Subjective: Feeling more pain now  Objective: BP 141/87  Pulse 82  Temp(Src) 97.9 F (36.6 C) (Oral)  Resp 18  Ht 4\' 9"  (1.448 m)  Wt 159 lb (72.122 kg)  BMI 34.41 kg/m2  SpO2 99%  Fetal Heart Rate: 135 Variability: ~5bpm Accelerations: absent Decelerations: absent  Contractions: Q2-58min  SVE:   Dilation: 1.5 Effacement (%): 80 Station: -1 Exam by:: RIch  Pitocin: NA Mag: 2gm/hr   Assessment / Plan: 20 y.o. G1P0000 at [redacted]w[redacted]d here for IOL secondary to pre-eclampsia  Labor: slow progress, cont induction.  Will discuss foley vs pit at this point.  Is now s/p SROM Preeclampsia:  On mag, bp well controlled Fetal Wellbeing: Category II Pain Control:  PRN IV I/D:  NA  Amanda Sharp 12/07/2010, 9:16 AM

## 2010-12-07 NOTE — Progress Notes (Signed)
Amanda Sharp is a 20 y.o. G1P0000 at [redacted]w[redacted]d admitted for induction of labor due to Preeclampsia.  Subjective: Pt comfortable with epidural.  Magnesium turned off @ 2100  Objective: BP 140/75  Pulse 96  Temp(Src) 97.4 F (36.3 C) (Oral)  Resp 17  Ht 4\' 9"  (1.448 m)  Wt 159 lb (72.122 kg)  BMI 34.41 kg/m2  SpO2 95% I/O last 3 completed shifts: In: 6790.4 [P.O.:2850; I.V.:3940.4] Out: 4675 [Urine:4675] I/O this shift: In: 901.8 [P.O.:480; I.V.:421.8] Out: 100 [Urine:100]  FHT:  FHR: 140 bpm, variability: moderate,  accelerations:  Present,  decelerations:  Present variable UC:   regular, every 3-5 minutes SVE:   Dilation: 8.5 Effacement (%): 100 Station: +1 Exam by:: D Nelson RN  Labs: Lab Results  Component Value Date   WBC 14.2* 12/07/2010   HGB 13.2 12/07/2010   HCT 39.0 12/07/2010   MCV 88.4 12/07/2010   PLT 182 12/07/2010    Assessment / Plan: Induction of labor for pre-eclampsia  Labor: Progressing normally on pitosin, s/p SROM Fetal Wellbeing:  Category II Pain Control:  Epidural I/D:  n/a Anticipated MOD:  NSVD Pre-eclampsia - BP stable, off Mag, Q6Hr CBC and CMP  [name] MD 12/07/2010, 11:26 PM

## 2010-12-07 NOTE — Progress Notes (Signed)
  Subjective:    [name], female    DOB: Jun 08, 1990, 20 y.o.   MRN: [id number]  Hypertension      Review of Systems     Objective:   Physical Exam        Assessment & Plan:   Amanda Sharp is a 20 y.o. G1P0000 at [redacted]w[redacted]d admitted for induction of labor due to preeclampsia .  Subjective: Pt feeling more drowsy and decreased reflexes per RN. Feeling contractions. IUPC placed.   Objective: BP 140/90  Pulse 80  Temp(Src) 97.8 F (36.6 C) (Oral)  Resp 22  Ht 4\' 9"  (1.448 m)  Wt 159 lb (72.122 kg)  BMI 34.41 kg/m2  SpO2 99% I/O last 3 completed shifts: In: 3247.5 [P.O.:1710; I.V.:1537.5] Out: 3650 [Urine:3650] I/O this shift: In: 2325.2 [P.O.:1140; I.V.:1185.2] Out: 750 [Urine:750]  FHT:  FHR: 130, moderate variability, no accels, early decels UC:   Contraction: every SVE:   Dilation: 4.5 Effacement (%): 80 Station: -1 Exam by:: Makaley Storts  Labs: Lab Results  Component Value Date   WBC 12.0* 12/07/2010   HGB 12.6 12/07/2010   HCT 37.6 12/07/2010   MCV 88.1 12/07/2010   PLT 176 12/07/2010    Assessment / Plan: Induction of labor due to preeclampsia. S/p foley bulb, cytotec. Now on Pitocin.   Labor: progressing on pitocin. IUPC in place Preeclampsia:  on magnesium sulfate. Check Mag level for mag toxicity Fetal Wellbeing:  Category II. Continue monitoring Pain Control:  Epidural: pt requested epidural I/D:  GBS neg Anticipated MOD:  NSVD  Vyron Fronczak 12/07/2010, 4:57 PM

## 2010-12-07 NOTE — Progress Notes (Signed)
Amanda Sharp is a 20 y.o. G1P0000 at [redacted]w[redacted]d admitted for induction of labor due to pre-eclampsia.  Subjective: Patient resting comfortably  Objective: BP 133/102  Pulse 82  Temp(Src) 97.9 F (36.6 C) (Oral)  Resp 18  Ht 4\' 9"  (1.448 m)  Wt 159 lb (72.122 kg)  BMI 34.41 kg/m2  SpO2 99% I/O last 3 completed shifts: In: 85.4 [I.V.:85.4] Out: -  I/O this shift: In: 1880 [P.O.:1230; I.V.:650] Out: 1925 [Urine:1925]  FHT:  FHR: 150 bpm, variability: moderate,  accelerations:  Present,  decelerations:  Present variable, late UC:   regular, every 3-6 minutes SVE:   Dilation: 1.5 Effacement (%): 60 Station: -2 Exam by:: dr Yisrael Obryan  Labs: Lab Results  Component Value Date   WBC 9.3 12/06/2010   HGB 12.3 12/06/2010   HCT 36.8 12/06/2010   MCV 87.6 12/06/2010   PLT 173 12/06/2010    Assessment / Plan: Induction of labor 2/2 pre-eclampsia  Labor: 2nd dose cytotec place @ 0000 Fetal Wellbeing:  Category II Pain Control:  Labor support without medications I/D:  n/a Anticipated MOD:  NSVD  [name] MD 12/07/2010, 12:50 AM

## 2010-12-08 ENCOUNTER — Encounter (HOSPITAL_COMMUNITY): Payer: Self-pay | Admitting: *Deleted

## 2010-12-08 DIAGNOSIS — O1414 Severe pre-eclampsia complicating childbirth: Secondary | ICD-10-CM

## 2010-12-08 DIAGNOSIS — D649 Anemia, unspecified: Secondary | ICD-10-CM

## 2010-12-08 DIAGNOSIS — O9903 Anemia complicating the puerperium: Secondary | ICD-10-CM

## 2010-12-08 LAB — COMPREHENSIVE METABOLIC PANEL
ALT: 95 U/L — ABNORMAL HIGH (ref 0–35)
AST: 56 U/L — ABNORMAL HIGH (ref 0–37)
AST: 37 U/L (ref 0–37)
Albumin: 2.4 g/dL — ABNORMAL LOW (ref 3.5–5.2)
Albumin: 1.8 g/dL — ABNORMAL LOW (ref 3.5–5.2)
Alkaline Phosphatase: 213 U/L — ABNORMAL HIGH (ref 39–117)
Alkaline Phosphatase: 212 U/L — ABNORMAL HIGH (ref 39–117)
Alkaline Phosphatase: 171 U/L — ABNORMAL HIGH (ref 39–117)
BUN: 24 mg/dL — ABNORMAL HIGH (ref 6–23)
BUN: 26 mg/dL — ABNORMAL HIGH (ref 6–23)
Calcium: 7.7 mg/dL — ABNORMAL LOW (ref 8.4–10.5)
Chloride: 99 mEq/L (ref 96–112)
Creatinine, Ser: 1.68 mg/dL — ABNORMAL HIGH (ref 0.50–1.10)
GFR calc Af Amer: >60 mL/min (ref >60)
GFR calc Af Amer: 46 mL/min — ABNORMAL LOW (ref >60)
Glucose, Bld: 102 mg/dL — ABNORMAL HIGH (ref 70–99)
Potassium: 4.4 mEq/L (ref 3.5–5.1)
Potassium: 4.1 mEq/L (ref 3.5–5.1)
Potassium: 4.2 mEq/L (ref 3.5–5.1)
Sodium: 129 mEq/L — ABNORMAL LOW (ref 135–145)
Total Bilirubin: 0.2 mg/dL — ABNORMAL LOW (ref 0.3–1.2)
Total Protein: 5.8 g/dL — ABNORMAL LOW (ref 6.0–8.3)
Total Protein: 6.3 g/dL (ref 6.0–8.3)

## 2010-12-08 LAB — CBC
HCT: 36.2 % (ref 36.0–46.0)
HCT: 28.4 % — ABNORMAL LOW (ref 36.0–46.0)
Hemoglobin: 12.2 g/dL (ref 12.0–15.0)
MCH: 29.8 pg (ref 26.0–34.0)
MCH: 29.9 pg (ref 26.0–34.0)
MCHC: 33.6 g/dL (ref 30.0–36.0)
MCHC: 33.7 g/dL (ref 30.0–36.0)
MCV: 88.7 fL (ref 78.0–100.0)
Platelets: 171 10*3/uL (ref 150–400)
Platelets: 164 10*3/uL (ref 150–400)
RBC: 3.2 MIL/uL — ABNORMAL LOW (ref 3.87–5.11)
RDW: 16.9 % — ABNORMAL HIGH (ref 11.5–15.5)
RDW: 16.7 % — ABNORMAL HIGH (ref 11.5–15.5)
WBC: 22.8 10*3/uL — ABNORMAL HIGH (ref 4.0–10.5)

## 2010-12-08 LAB — MAGNESIUM
Magnesium: 8.6 mg/dL (ref 1.5–2.5)
Magnesium: 6.8 mg/dL (ref 1.5–2.5)

## 2010-12-08 MED ORDER — MAGNESIUM SULFATE 40 G IN LACTATED RINGERS - SIMPLE
0.5000 g/h | INTRAVENOUS | Status: DC
  Administered 2010-12-08: 2 g/h via INTRAVENOUS
  Filled 2010-12-08: qty 500

## 2010-12-08 MED ORDER — LACTATED RINGERS IV SOLN
INTRAVENOUS | Status: DC
  Administered 2010-12-08 – 2010-12-09 (×2): 100 mL/h via INTRAVENOUS

## 2010-12-08 MED ORDER — OXYCODONE-ACETAMINOPHEN 5-325 MG PO TABS
1.0000 | ORAL_TABLET | ORAL | Status: DC | PRN
  Administered 2010-12-08 – 2010-12-09 (×2): 1 via ORAL
  Filled 2010-12-08 (×2): qty 1

## 2010-12-08 MED ORDER — DIPHENHYDRAMINE HCL 25 MG PO CAPS: 25.0000 mg | ORAL_CAPSULE | Freq: Four times a day (QID) | ORAL | Status: DC | PRN

## 2010-12-08 MED ORDER — ONDANSETRON HCL 4 MG/2ML IJ SOLN: 4.0000 mg | INTRAMUSCULAR | Status: DC | PRN

## 2010-12-08 MED ORDER — PRENATAL PLUS 27-1 MG PO TABS
1.0000 | ORAL_TABLET | Freq: Every day | ORAL | Status: DC
  Administered 2010-12-09 – 2010-12-10 (×2): 1 via ORAL
  Filled 2010-12-08 (×2): qty 1

## 2010-12-08 MED ORDER — SENNOSIDES-DOCUSATE SODIUM 8.6-50 MG PO TABS
2.0000 | ORAL_TABLET | Freq: Every day | ORAL | Status: DC
  Administered 2010-12-09: 2 via ORAL

## 2010-12-08 MED ORDER — WITCH HAZEL-GLYCERIN EX PADS: 1.0000 "application " | MEDICATED_PAD | CUTANEOUS | Status: DC | PRN

## 2010-12-08 MED ORDER — LANOLIN HYDROUS EX OINT: TOPICAL_OINTMENT | CUTANEOUS | Status: DC | PRN

## 2010-12-08 MED ORDER — DIBUCAINE 1 % RE OINT: 1.0000 "application " | TOPICAL_OINTMENT | RECTAL | Status: DC | PRN

## 2010-12-08 MED ORDER — ZOLPIDEM TARTRATE 5 MG PO TABS: 5.0000 mg | ORAL_TABLET | Freq: Every evening | ORAL | Status: DC | PRN

## 2010-12-08 MED ORDER — TETANUS-DIPHTH-ACELL PERTUSSIS 5-2.5-18.5 LF-MCG/0.5 IM SUSP
0.5000 mL | Freq: Once | INTRAMUSCULAR | Status: AC
  Administered 2010-12-09: 0.5 mL via INTRAMUSCULAR
  Filled 2010-12-08: qty 0.5

## 2010-12-08 MED ORDER — SIMETHICONE 80 MG PO CHEW: 80.0000 mg | CHEWABLE_TABLET | ORAL | Status: DC | PRN

## 2010-12-08 MED ORDER — BENZOCAINE-MENTHOL 20-0.5 % EX AERO: 1.0000 "application " | INHALATION_SPRAY | CUTANEOUS | Status: DC | PRN

## 2010-12-08 MED ORDER — ONDANSETRON HCL 4 MG PO TABS: 4.0000 mg | ORAL_TABLET | ORAL | Status: DC | PRN

## 2010-12-08 MED ORDER — IBUPROFEN 600 MG PO TABS
600.0000 mg | ORAL_TABLET | Freq: Four times a day (QID) | ORAL | Status: DC
  Administered 2010-12-08 – 2010-12-10 (×8): 600 mg via ORAL
  Filled 2010-12-08 (×8): qty 1

## 2010-12-08 MED ORDER — LACTATED RINGERS IV BOLUS (SEPSIS)
500.0000 mL | Freq: Once | INTRAVENOUS | Status: AC
  Administered 2010-12-08: 500 mL via INTRAVENOUS

## 2010-12-08 NOTE — Progress Notes (Signed)
Results for [facility] AISSATA, WILMORE (MRN [id number]) as of 12/08/2010 17:37  Ref. Range 12/08/2010 16:35  Magnesium Latest Range: 1.5-2.5 mg/dL 6.7 (HH)  Mosetta Putt, Sheritha Louis A Called to [place],CNP-see new orders

## 2010-12-08 NOTE — Consult Note (Signed)
Reviewed lab results/vitals/cervical status/input and output>recheck mag level after delivery.

## 2010-12-08 NOTE — Progress Notes (Signed)
Amanda Sharp is a 20 y.o. G1P0000 at [redacted]w[redacted]d admitted for induction for preeclampsia  Subjective: Feeling shoulder pain.  Objective: BP 125/77  Pulse 95  Temp(Src) 98.9 F (37.2 C) (Oral)  Resp 18  Ht 4\' 9"  (1.448 m)  Wt 159 lb (72.122 kg)  BMI 34.41 kg/m2  SpO2 96% I/O last 3 completed shifts: In: 6790.4 [P.O.:2850; I.V.:3940.4] Out: 4675 [Urine:4675] I/O this shift: In: 1751.8 [P.O.:480; I.V.:1271.8] Out: 225 [Urine:225]  FHT:  FHR: 140 bpm, variability: moderate,  accelerations:  Abscent,  decelerations:  Absent UC:   regular, every 5 minutes SVE:   Dilation: Lip/rim Effacement (%): 100 Station: +1 Exam by:: D Nelson   Labs: Lab Results  Component Value Date   WBC 17.9* 12/08/2010   HGB 12.5 12/08/2010   HCT 37.2 12/08/2010   MCV 88.6 12/08/2010   PLT 171 12/08/2010    Assessment / Plan: Induction of labor for pre-eclampsia.  Labor: On pitocin Fetal Wellbeing:  Category II Pain Control:  Epidural I/D:  n/a Anticipated MOD:  NSVD Preeclampsia - BP stable off Mag, will recheck Mag level.  Only 15mL urine in last hour, on IVF. Cont to monitor CBC and CMP Q6Hr   Amanda Sharp. 12/08/2010, 4:47 AM

## 2010-12-08 NOTE — Anesthesia Postprocedure Evaluation (Signed)
Anesthesia Post Note  Patient: Amanda Sharp  Procedure(s) Performed: * No procedures listed *  Anesthesia type: Epidural  Patient location: Mother/Baby  Post pain: Pain level controlled  Post assessment: Post-op Vital signs reviewed  Last Vitals:  Filed Vitals:   12/08/10 1500  BP: 132/82  Pulse: 94  Temp:   Resp: 16    Post vital signs: Reviewed  Level of consciousness: awake  Complications: No apparent anesthesia complications

## 2010-12-08 NOTE — Consult Note (Addendum)
Neonatology Note:   Attendance at Delivery  I was asked to attend this NSVD at term due to maternal PIH, on magnesium sulfate. The mother is a G1P0 A pos, GBS neg with PIH, being induced. Fluid clear. Vacuum extraction had been attempted, but baby delivered in a single easy push. His tone was decreased at birth, but he cried just after bulb suctioning was done. After that, he had no further resp problems, breathing regularly. Lungs clear to ausc in DR.  I spoke with the parents in the DR, then transferred the baby to the Pediatrician's care. Mellody Memos, MD

## 2010-12-08 NOTE — Progress Notes (Signed)
Post Partum Day 0 Subjective: patient denies resp difficulty, denies headache.  She DID vomit after dinner. Most recent BP 108/81 (MAP 87) pulse 107, SaO2 96% ra.   Objective: Blood pressure 147/84, pulse 94, temperature 97.8 F (36.6 C), temperature source Oral, resp. rate 16, height 4\' 8"  (1.422 m), weight 73.392 kg (161 lb 12.8 oz), SpO2 98.00%, unknown if currently breastfeeding.  Physical Exam:  General: alert and fatigued Lochia: appropriate Uterine Fundus: firm Incision: n/a DVT Evaluation: No evidence of DVT seen on physical exam. Reflexes 1+ no clonus   Basename 12/08/10 1208 12/08/10 0625  HGB 9.5* 12.2  HCT 28.4* 36.2  .magnesium 6.8 up from 6.7 despite good uop and MG sulfate on 1 gm /hr  Assessment/Plan: Postpartum day 0 s/p svd , preeclampsia with dimished urine output during labor, now adeq uop Hypermagnesemia, slightly above desired therapeutic levels. Plan:  Reduce to 0.5 mg / hour til discontinuation of Magnesium at 10 am.  Transfer to floor 4 hrs after completion of Magnesium therapy.   LOS: 2 days   Amanda Sharp V 12/08/2010, 11:45 PM

## 2010-12-08 NOTE — Progress Notes (Signed)
UR chart review completed.  

## 2010-12-08 NOTE — Progress Notes (Signed)
Amanda Sharp is a 20 y.o. G1P0000 at [redacted]w[redacted]d with sever pre eclampsia  Subjective:   Objective: BP 132/101  Pulse 140  Temp(Src) 98.2 F (36.8 C) (Axillary)  Resp 20  Ht 4\' 9"  (1.448 m)  Wt 72.122 kg (159 lb)  BMI 34.41 kg/m2  SpO2 100% I/O last 3 completed shifts: In: 9078.7 [P.O.:3450; I.V.:5628.7] Out: 4905 [Urine:4905] I/O this shift: In: 367.6 [I.V.:367.6] Out: 5 [Urine:5]  Lungs:  CTA B FHT:  FHR: 140 bpm, variability: moderate,  accelerations:  Present,  decelerations:  Present mild variable with pushing UC:   regular, every 5 minutes SVE:   Dilation: 10 Effacement (%): 100 Station: +1 Exam by:: DN  Labs: Lab Results  Component Value Date   WBC 20.4* 12/08/2010   HGB 12.2 12/08/2010   HCT 36.2 12/08/2010   MCV 88.7 12/08/2010   PLT 167 12/08/2010    Assessment / Plan: Induction of labor due to preeclampsia,  progressing well on pitocin.  Decreased urine output.   Pt has received fluid bolus with little response Pt does not have pulmonary edema so no lasix at this time.  NSVD expected soon. FHT reassuring  LEGGETT,KELLY H. 12/08/2010, 9:49 AM

## 2010-12-09 LAB — BASIC METABOLIC PANEL
BUN: 29 mg/dL — ABNORMAL HIGH (ref 6–23)
CO2: 23 mEq/L (ref 19–32)
Chloride: 101 mEq/L (ref 96–112)
GFR calc Af Amer: 54 mL/min — ABNORMAL LOW (ref >60)
Glucose, Bld: 85 mg/dL (ref 70–99)
Potassium: 4.3 mEq/L (ref 3.5–5.1)

## 2010-12-09 LAB — CBC
HCT: 20.9 % — ABNORMAL LOW (ref 36.0–46.0)
Hemoglobin: 7.1 g/dL — ABNORMAL LOW (ref 12.0–15.0)
RBC: 2.38 MIL/uL — ABNORMAL LOW (ref 3.87–5.11)
WBC: 13.1 10*3/uL — ABNORMAL HIGH (ref 4.0–10.5)

## 2010-12-09 NOTE — Progress Notes (Signed)
Post Partum Day 1 Subjective: no complaints, voiding and denies pain , headache  Urine output dramatically improved, now over 200 cc/hr  Objective:BP 118/82  Pulse 73  Temp(Src) 98.1 F (36.7 C) (Oral)  Resp 18  Ht 4\' 8"  (1.422 m)  Wt 73.392 kg (161 lb 12.8 oz)  BMI 36.27 kg/m2  SpO2 96%  Breastfeeding? Unknown I/O improved, +900 over last 24 hrs despite generous urine output  Physical Exam:  General: alert and cooperative Lochia: appropriate Uterine Fundus: firm Incision: n/a DVT Evaluation: No evidence of DVT seen on physical exam. Legs reflexes 1+ no clonus   Basename 12/09/10 0512 12/08/10 1208  HGB 7.1* 9.5*  HCT 20.9* 28.4*    Assessment/Plan: PPD 1 s.p vag del, on MG sulfate for PreEclampsia, Resolved volume depletion,  Postpartum anemia.  Complete MG sulfate at 10 am, then routine postpartum care x 24 hrs.  At present will not need oral antihypertensives. Breast feeding Contraception undecided at present   LOS: 3 days   Keldrick Pomplun V 12/09/2010, 6:54 AM

## 2010-12-10 MED ORDER — IBUPROFEN 600 MG PO TABS: 600.0000 mg | ORAL_TABLET | Freq: Four times a day (QID) | ORAL | Status: AC

## 2010-12-10 MED ORDER — DOCUSATE SODIUM 100 MG PO CAPS: 100.0000 mg | ORAL_CAPSULE | Freq: Two times a day (BID) | ORAL | Status: AC

## 2010-12-10 NOTE — Progress Notes (Signed)
Post Partum Day 2 Subjective: no complaints, up ad lib, voiding and tolerating PO  Objective: Blood pressure 99/69, pulse 64, temperature 98 F (36.7 C), temperature source Oral, resp. rate 18, height 4\' 8"  (1.422 m), weight 161 lb 12.8 oz (73.392 kg), SpO2 94.00%, unknown if currently breastfeeding.  Physical Exam:  General: alert and cooperative Lochia: appropriate Uterine Fundus: firm DVT Evaluation: No evidence of DVT seen on physical exam. Negative Homan's sign.   Basename 12/09/10 0512 12/08/10 1208  HGB 7.1* 9.5*  HCT 20.9* 28.4*    Assessment/Plan: Discharge home and Breastfeeding  Undecide on contraception  Will follow up in 1 week for blood pressure check   LOS: 4 days   Delton See S. 12/10/2010, 7:20 AM

## 2010-12-10 NOTE — Anesthesia Postprocedure Evaluation (Signed)
  Anesthesia Post-op Note  Patient: Amanda Sharp  Procedure(s) Performed: * No procedures listed *  Patient Location: PACU and Mother/Baby  Anesthesia Type: Epidural  Level of Consciousness: awake, alert , oriented, patient cooperative and responds to stimulation  Airway and Oxygen Therapy: Patient Spontanous Breathing  Post-op Pain: none  Post-op Assessment: Post-op Vital signs reviewed, Patient's Cardiovascular Status Stable, Respiratory Function Stable, No signs of Nausea or vomiting and Pain level controlled  Post-op Vital Signs: Reviewed and stable  Complications: No apparent anesthesia complications

## 2010-12-10 NOTE — Discharge Summary (Signed)
Obstetric Discharge Summary Reason for Admission: induction of labor and preeclampsia Prenatal Procedures: Preeclampsia and ultrasound Intrapartum Procedures: spontaneous vaginal delivery Postpartum Procedures: none Complications-Operative and Postpartum: 1st degree perineal laceration Hemoglobin  Date Value Range Status  12/09/2010 7.1* 12.0-15.0 (g/dL) Final     DELTA CHECK NOTED     REPEATED TO VERIFY     HCT  Date Value Range Status  12/09/2010 20.9* 36.0-46.0 (%) Final    Discharge Diagnoses: Term Pregnancy-delivered and [facility] Course: Patient presented at 38+3wks with elevated BPs at the MAU and was admitted for induction.  During labor, patient received magnesium sulfate due to her elevated BPs, low platelets, and elevated LFTs.  Her urine output significantly decreased, but resolved post-partum.  She received 24hrs of mag post partum.  Her blood pressures remained stable.  On PPD#2 she was AFVSS  And meeting all goals for discharge home.  She is breast feeding and will follow up with the health department  Discharge Information: Date: 12/10/2010 Activity: pelvic rest Diet: routine Medications: Ibuprophen and Colace Condition: stable Instructions: refer to practice specific booklet Discharge to: home   Newborn Data: Live born female  Birth Weight: 6 lb 3.8 oz (2829 g) APGAR: 7, 9  Home with mother.  [name]. 12/10/2010, 7:11 AM

## 2010-12-10 NOTE — Addendum Note (Signed)
Addendum  created 12/10/10 0848 by Suella Grove   Modules edited:Charges VN, Notes Section

## 2010-12-13 ENCOUNTER — Other Ambulatory Visit: Payer: Self-pay | Admitting: Family Medicine

## 2010-12-13 MED ORDER — FERROUS SULFATE 325 (65 FE) MG PO TABS: 325.0000 mg | ORAL_TABLET | Freq: Two times a day (BID) | ORAL | Status: DC

## 2010-12-13 NOTE — Discharge Summary (Signed)
Agree with d/c home.  Pt needs iron.  She was otherwise asymptomatic and hemodynamically stable.  Amanda Sharp 11:55 AM 12/13/2010

## 2010-12-15 ENCOUNTER — Other Ambulatory Visit: Payer: Self-pay | Admitting: Family Medicine

## 2011-11-08 IMAGING — US US RENAL
1 series · 14 of 25 positions shown · non-contrast
Comparison: None.

CLINICAL DATA: Abdominal pain, flank pain.

RENAL/URINARY TRACT ULTRASOUND COMPLETE

[Series 1: us renal · 14 of 38 slices shown]
[im 1/38]
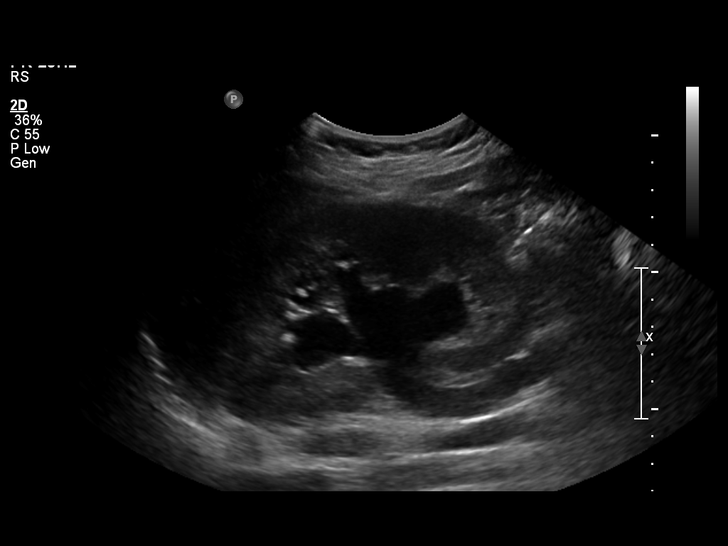
[im 4/38]
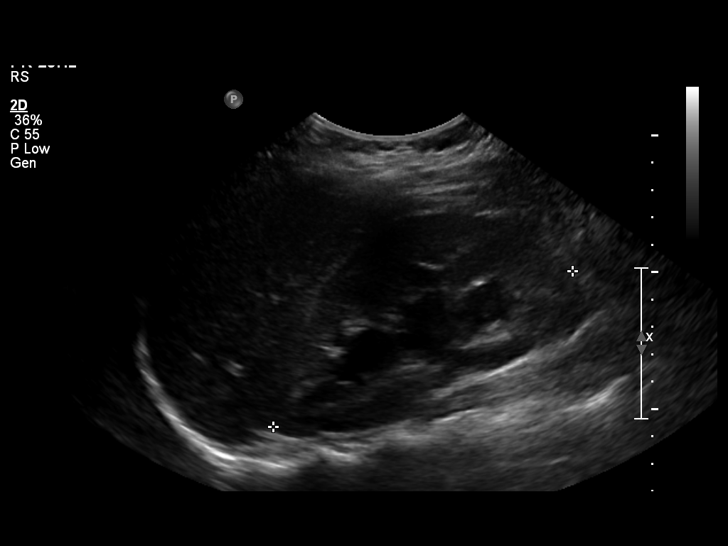
[im 7/38]
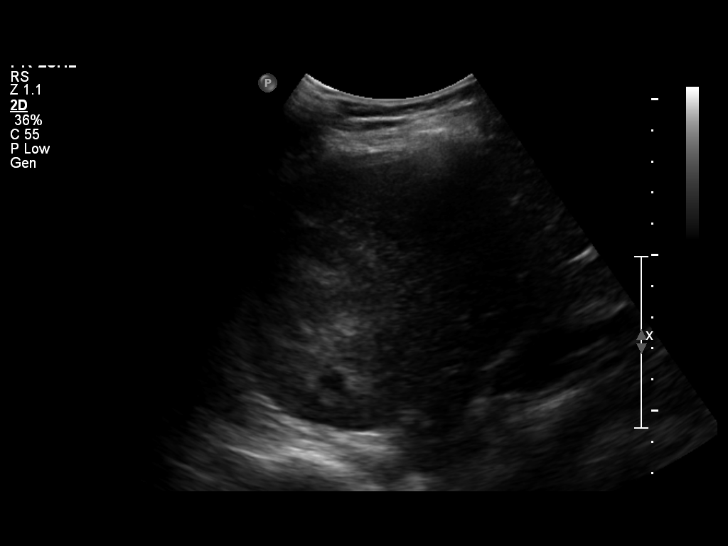
[im 10/38]
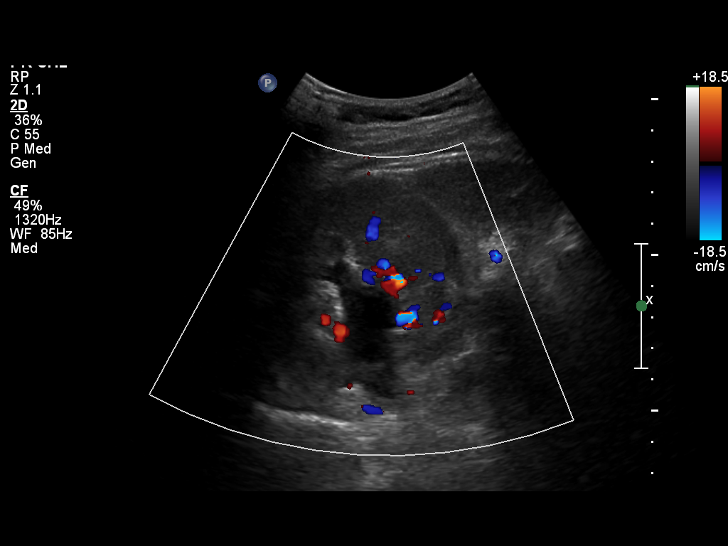
[im 13/38]
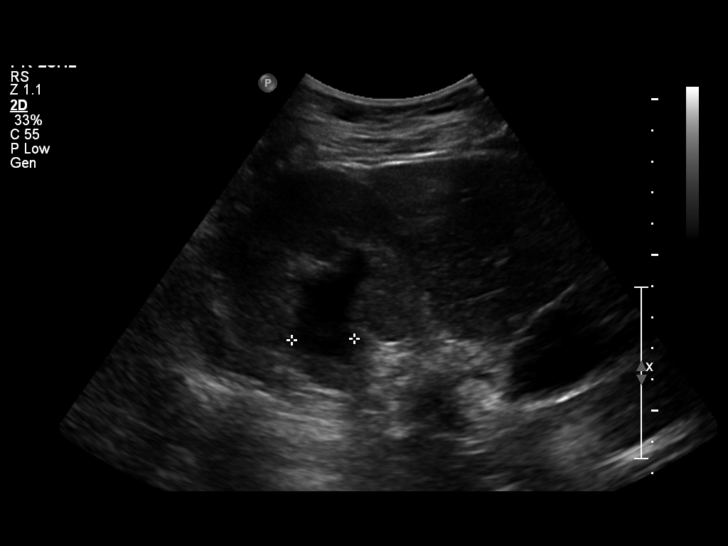
[im 14/38]
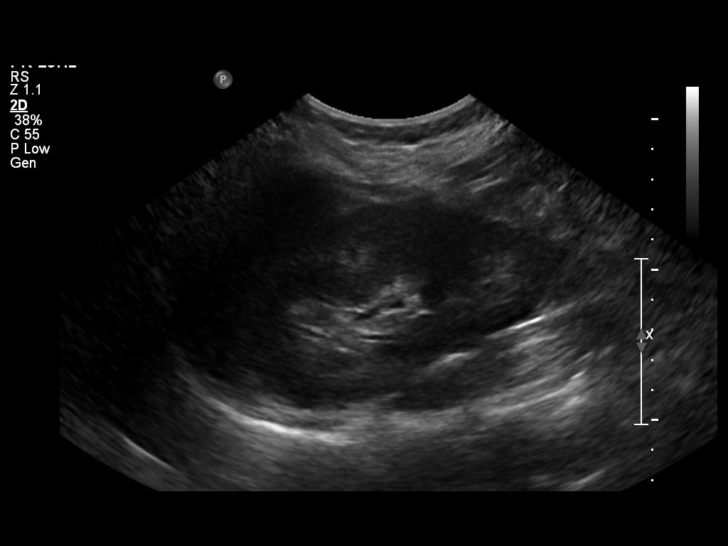
[im 17/38]
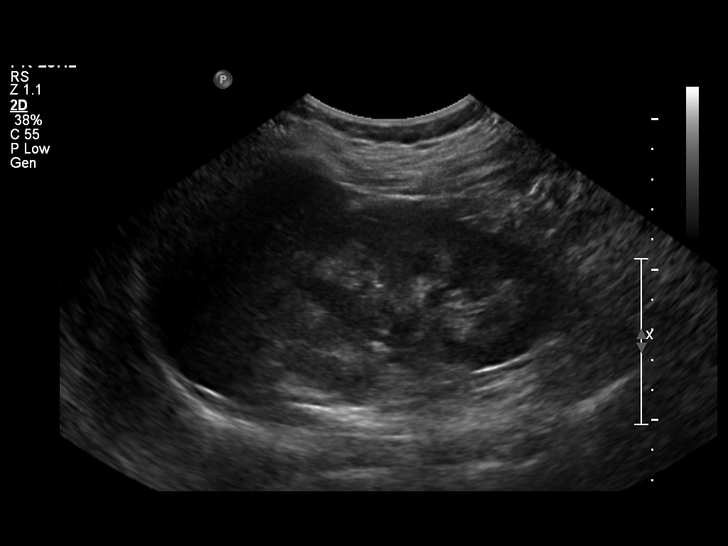
[im 21/38]
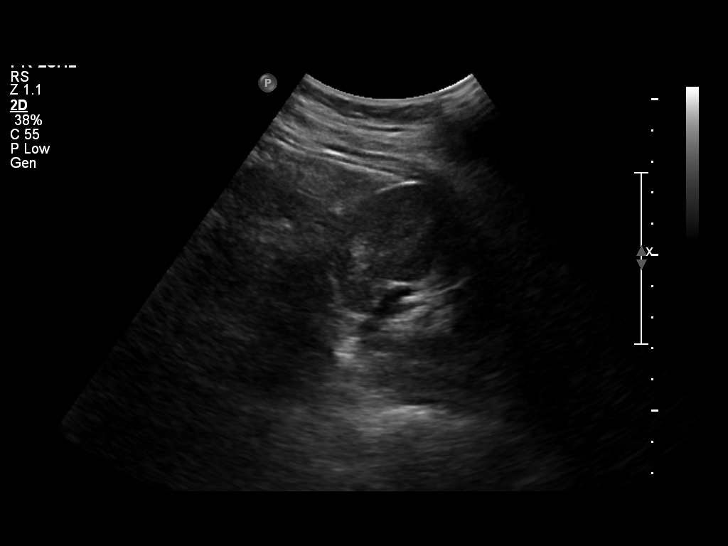
[im 24/38]
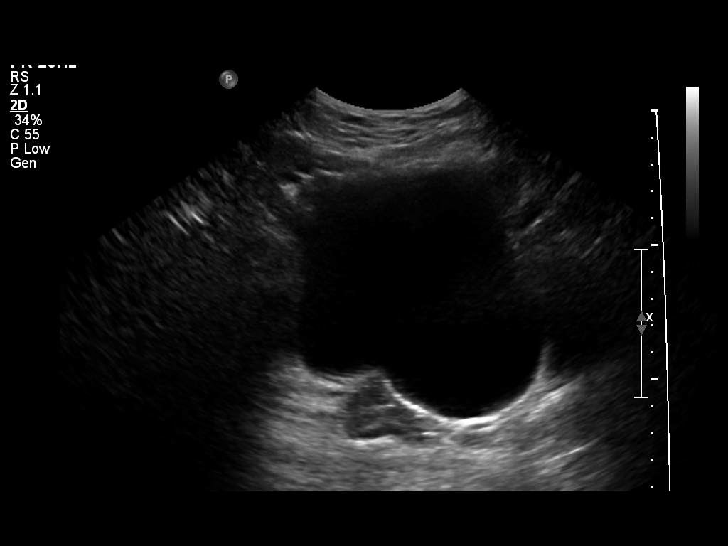
[im 25/38]
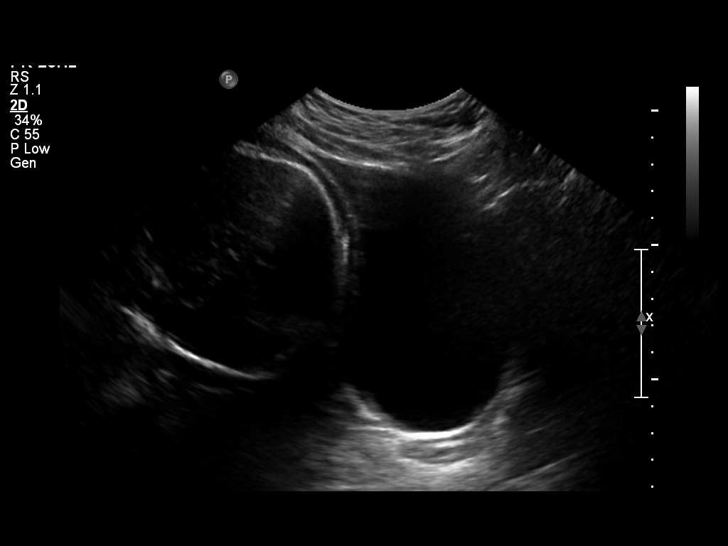
[im 28/38]
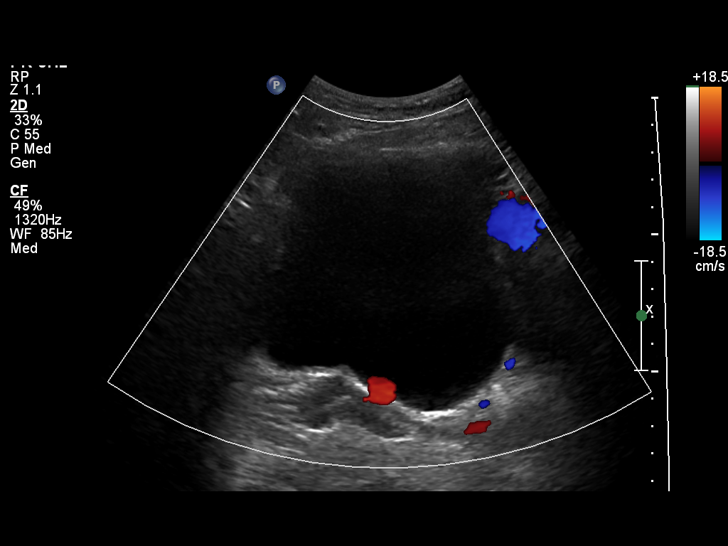
[im 31/38]
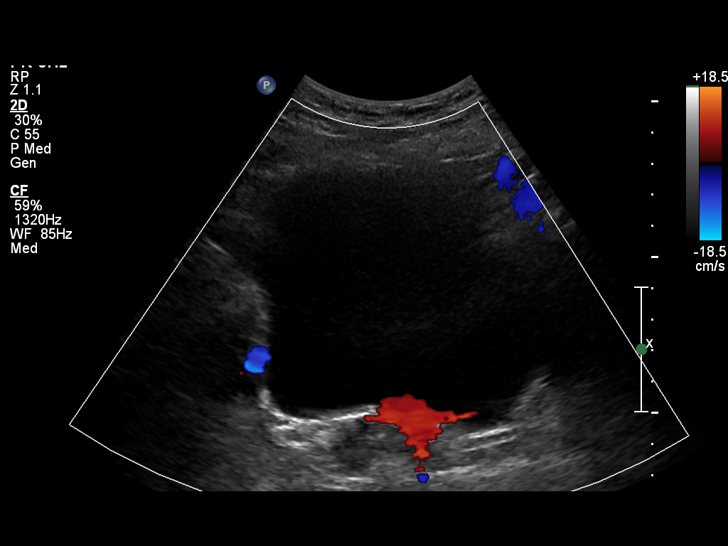
[im 34/38]
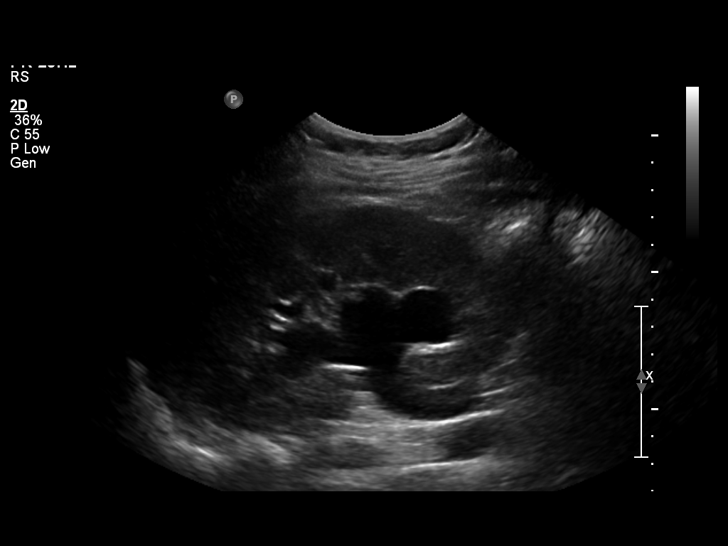
[im 38/38]
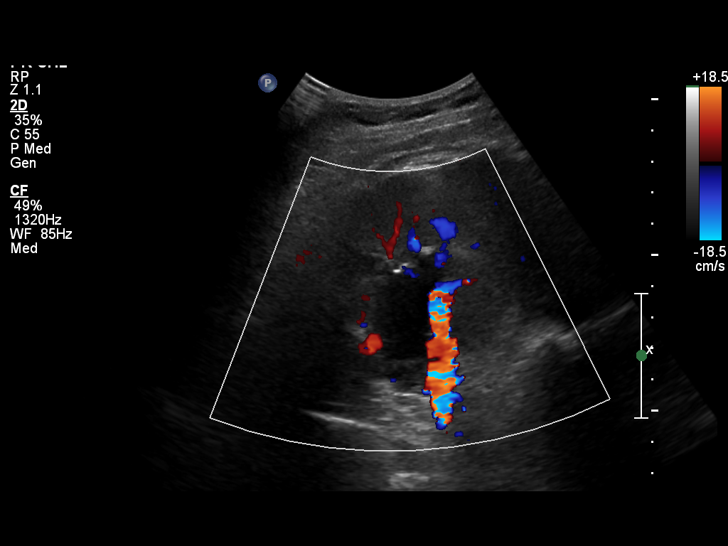

[14 of 25 positions shown; findings below may reference images not displayed]

FINDINGS: Right Kidney:  12.3 cm.  Moderate hydronephrosis.  Normal
echotexture.  No focal abnormality.

Left Kidney:  11.1 cm. Normal size and echotexture.  No focal
abnormality.  No hydronephrosis.

Bladder:  Normal appearance.  Left ureteral jet visualized.  Right
ureteral jet not visualized.  The
IMPRESSION: Moderate right hydronephrosis.  No right ureteral jet seen within
the bladder.  Findings are concerning for possible ureteral stone
and obstruction.

## 2011-11-08 IMAGING — US US ABDOMEN LIMITED
1 series · 12 of 12 positions shown · non-contrast
Comparison: 08/16/2010

CLINICAL DATA: Rule out appendicitis

LIMITED ABDOMINAL ULTRASOUND

[Series 1: us abdomen limited · 12 acquisitions, 12 frames shown]
[im 1/12]
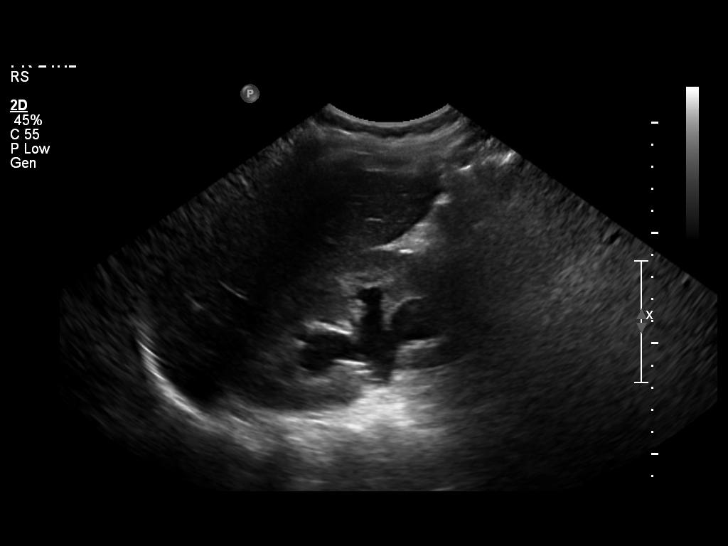
[im 2/12]
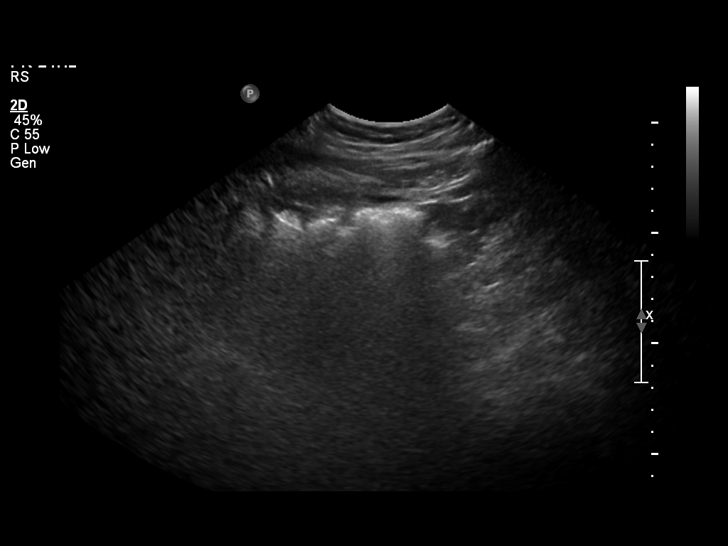
[im 3/12]
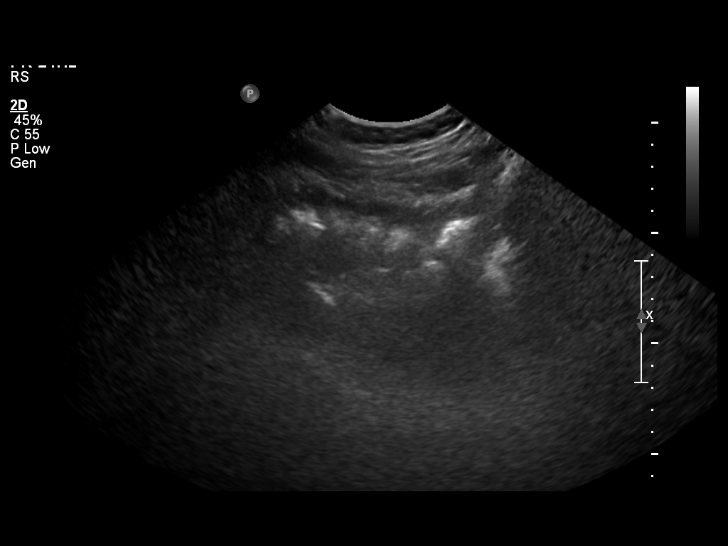
[im 4/12]
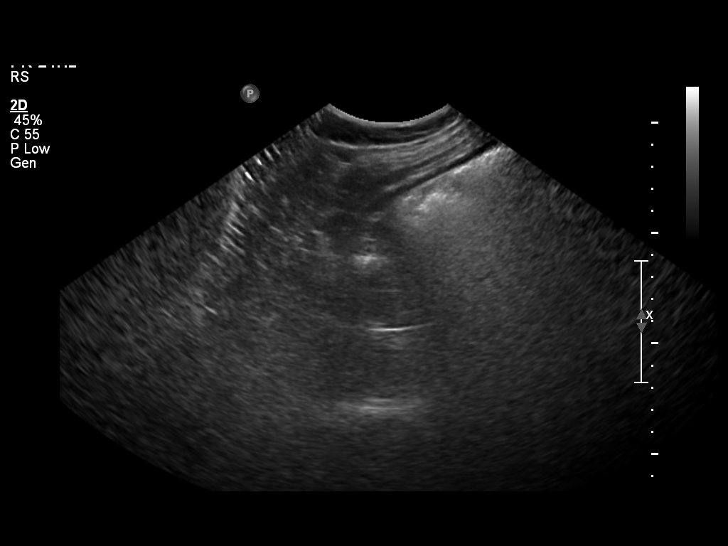
[im 5/12]
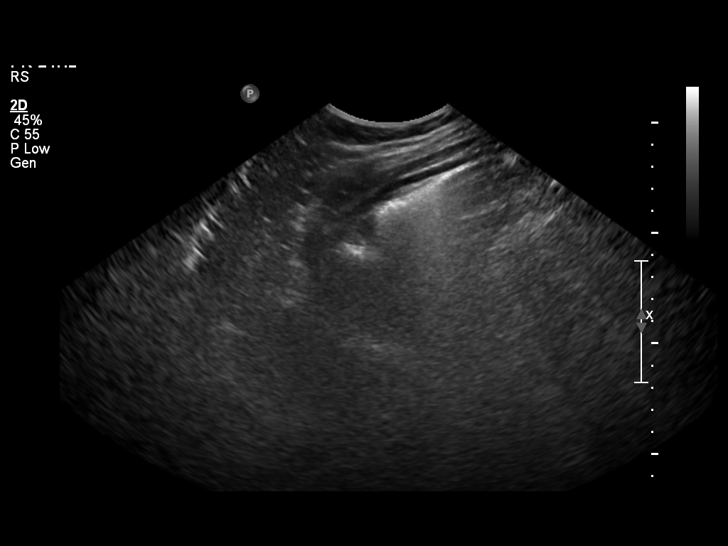
[im 6/12]
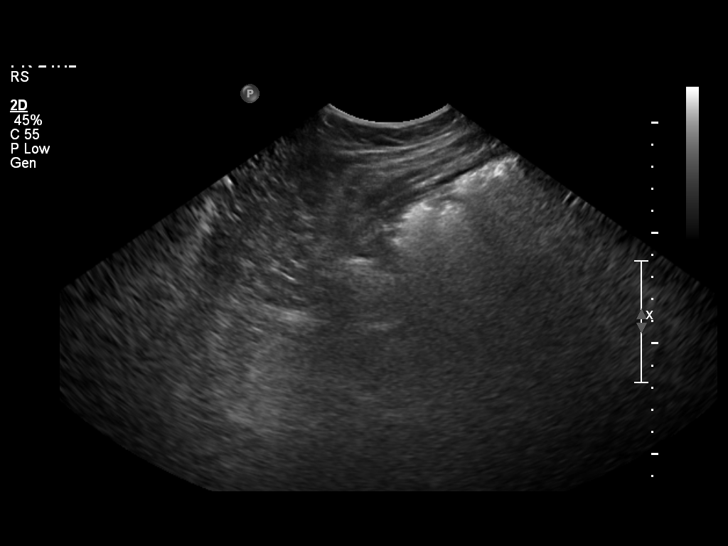
[im 7/12]
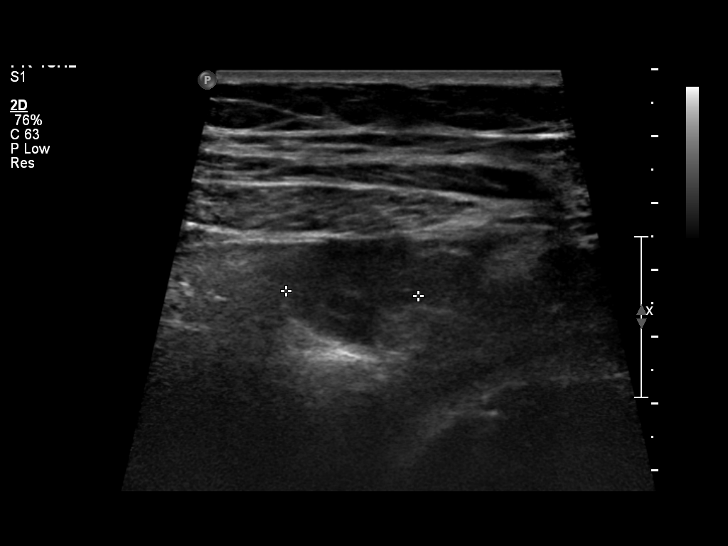
[im 8/12]
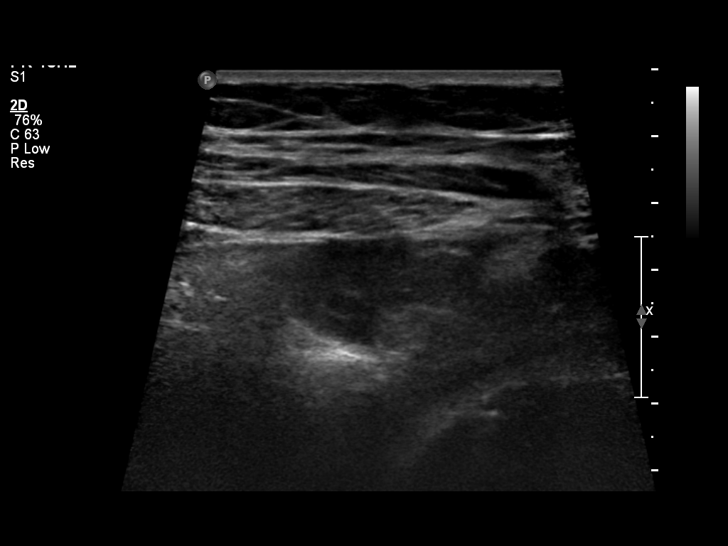
[im 9/12]
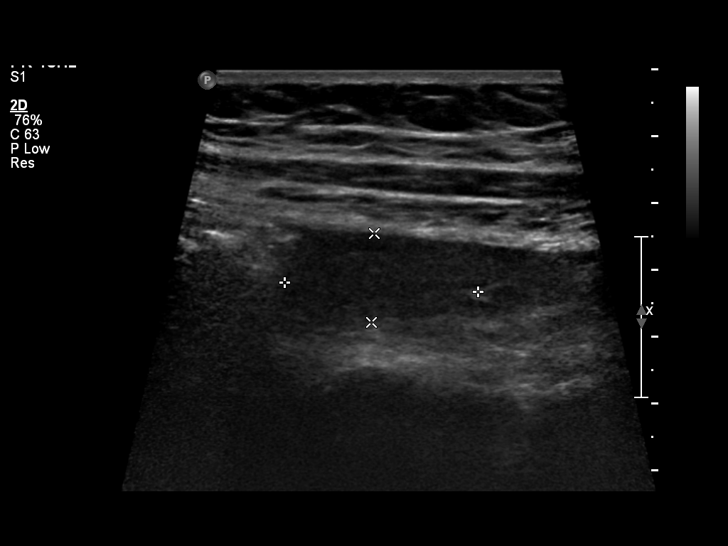
[im 10/12]
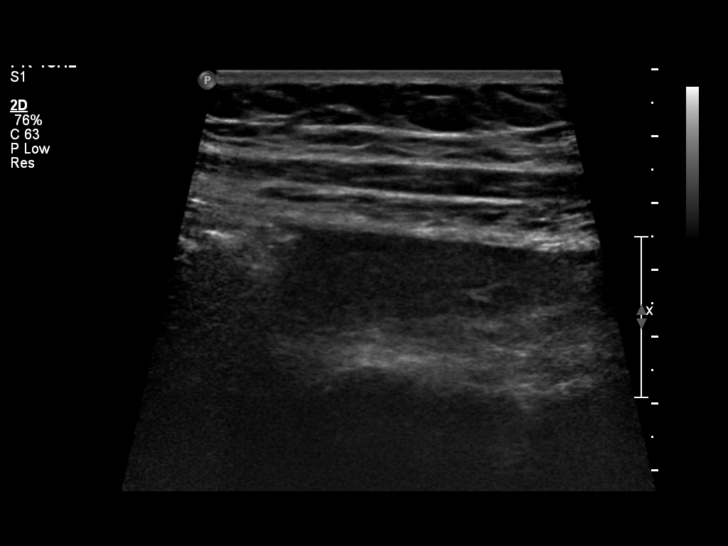
[im 11/12]
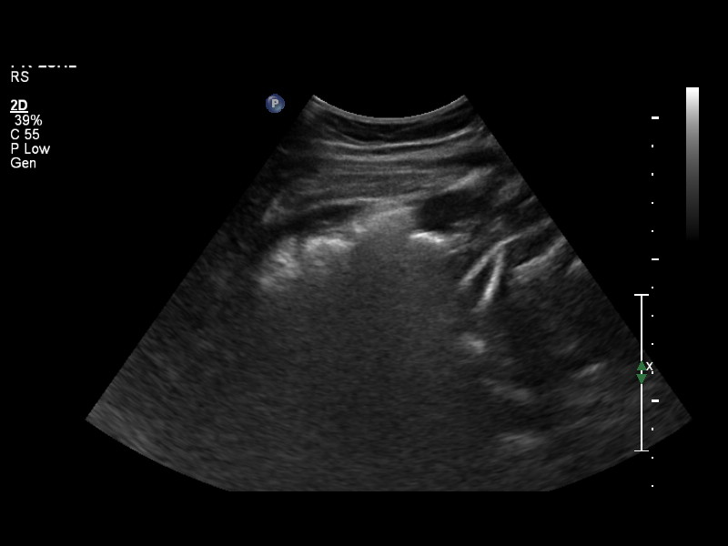
[im 12/12]
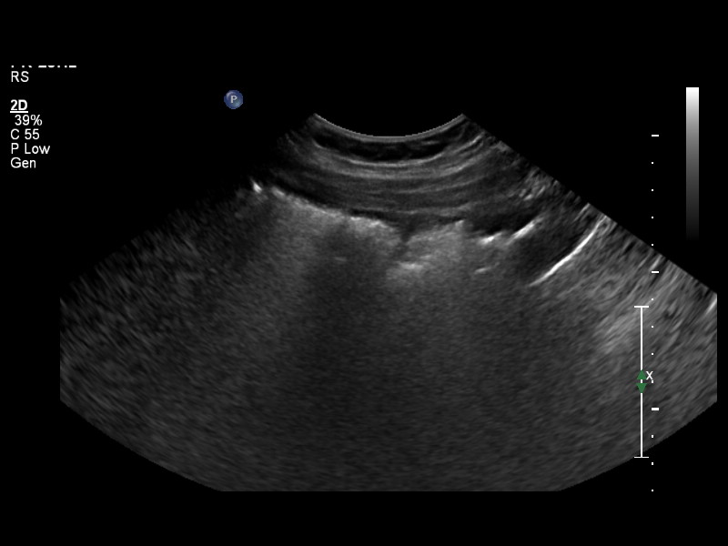

[12 of 12 positions shown; findings below may reference images not displayed]

FINDINGS: The patient is pregnant.  There is moderate right
hydronephrosis.

Scanning was limited to the right lower quadrant.  The appendix is
not visualized.  No bowel edema or thickening is identified in the
right lower quadrant.  Some of the area is obscured by bowel gas.

Right ovary is visualized and appears normal.  No free fluid is
seen in the right lower quadrant.
IMPRESSION: Moderate right hydronephrosis.

The appendix is not visualized.  No evidence of acute appendicitis.

## 2011-12-12 IMAGING — US US RENAL
1 series · 14 of 25 positions shown · non-contrast
Comparison: Renal ultrasound 10/21/2010

CLINICAL DATA: 36 weeks pregnant.  Follow-up hydronephrosis.

RENAL/URINARY TRACT ULTRASOUND COMPLETE

[Series 1: us renal · 42 acquisitions, 14 frames shown]
[im 1/42]
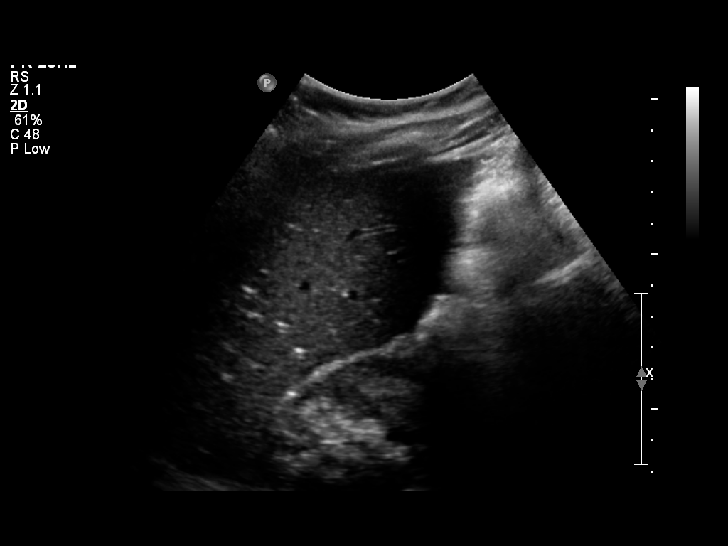
[im 4/42]
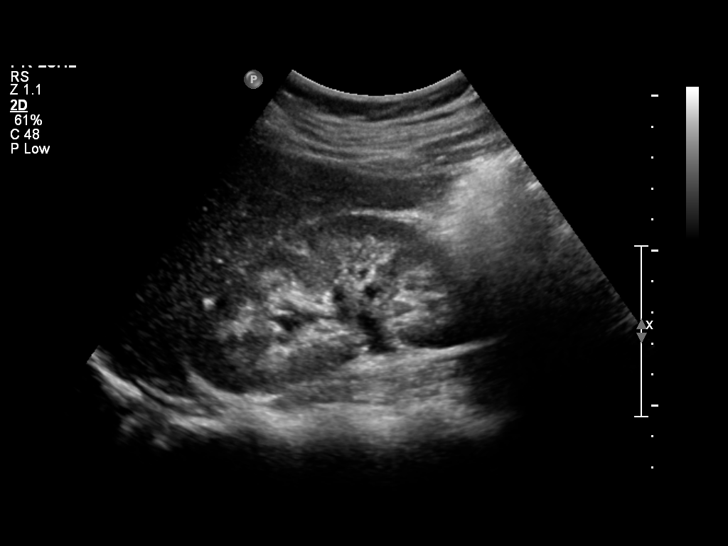
[im 7/42]
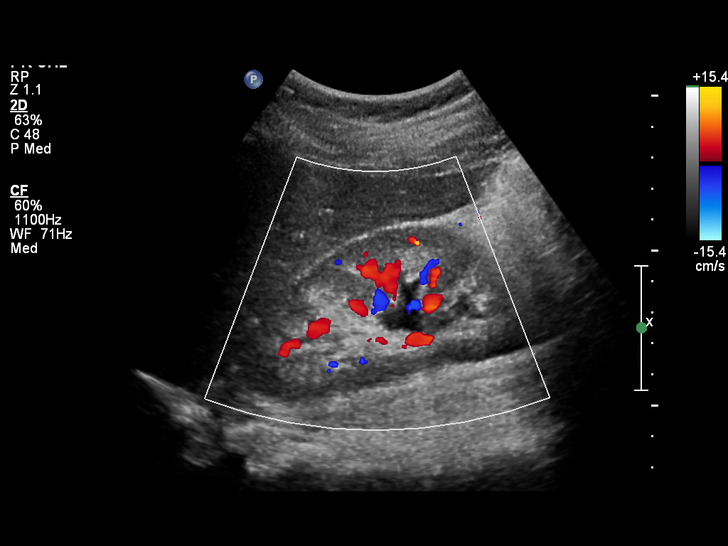
[im 11/42]
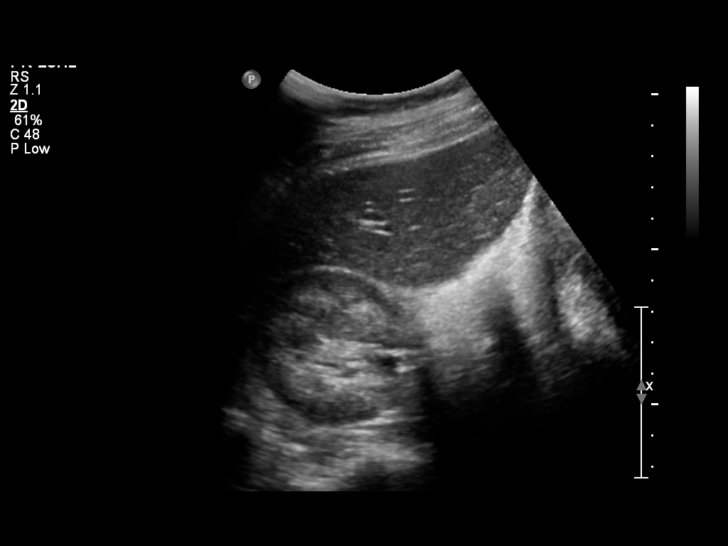
[im 14/42]
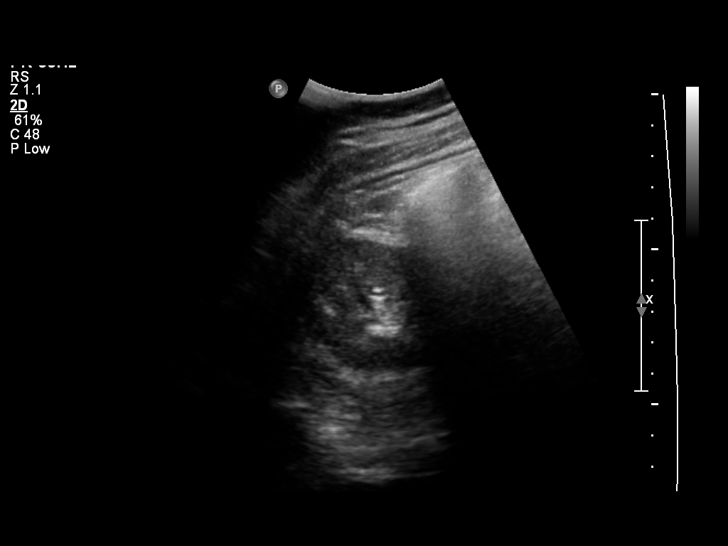
[im 16/42]
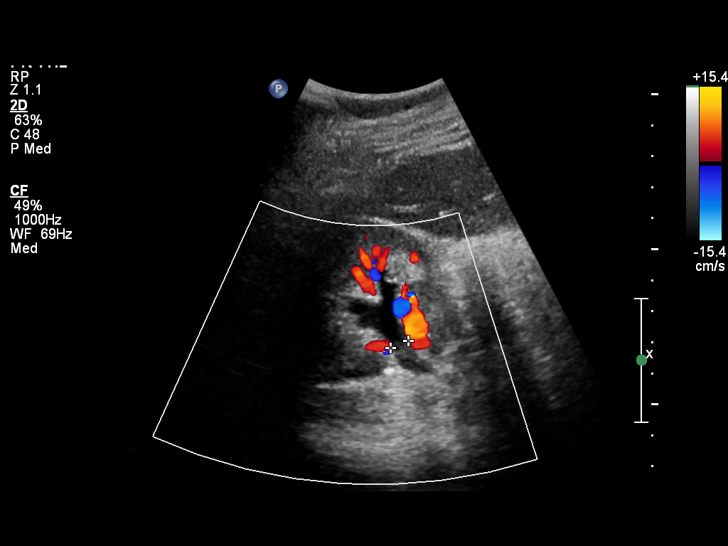
[im 19/42]
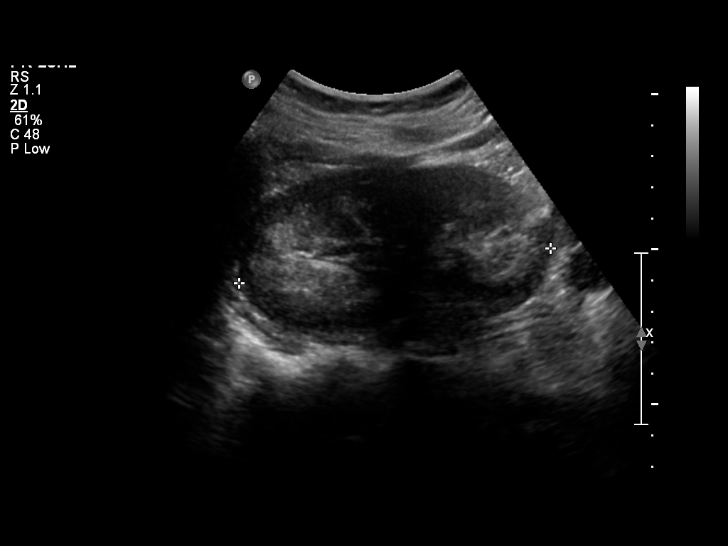
[im 23/42]
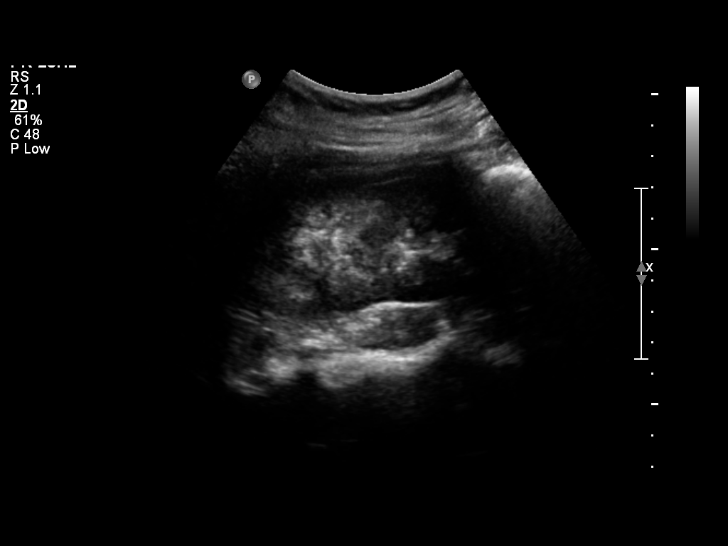
[im 26/42]
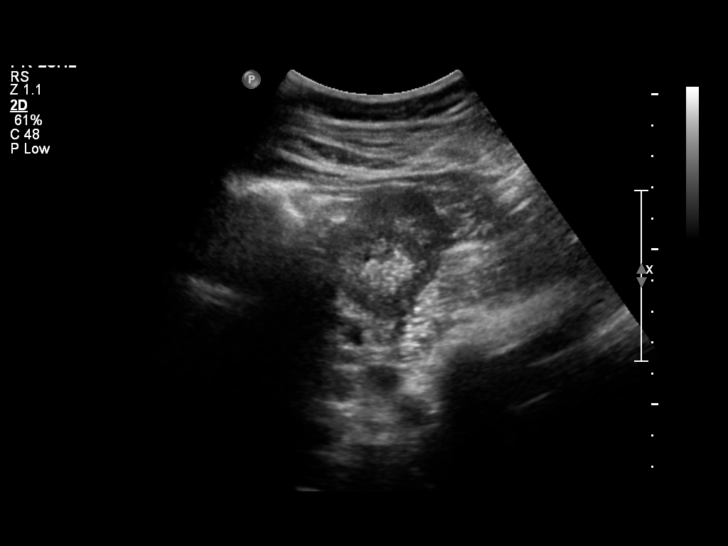
[im 28/42]
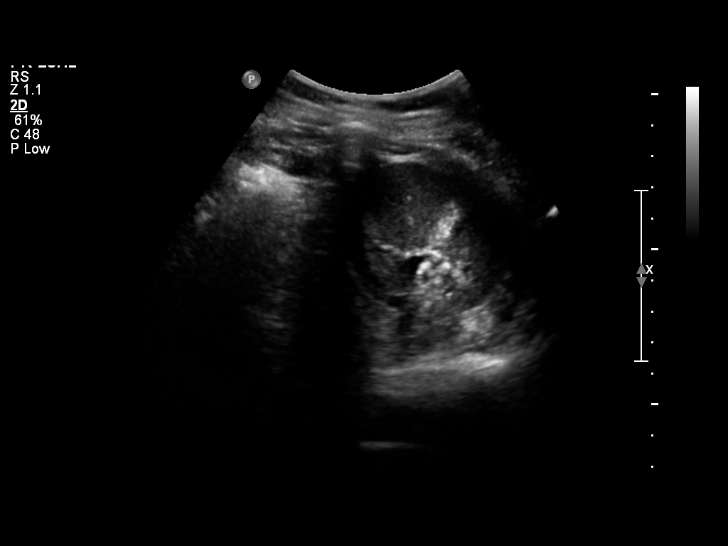
[im 31/42]
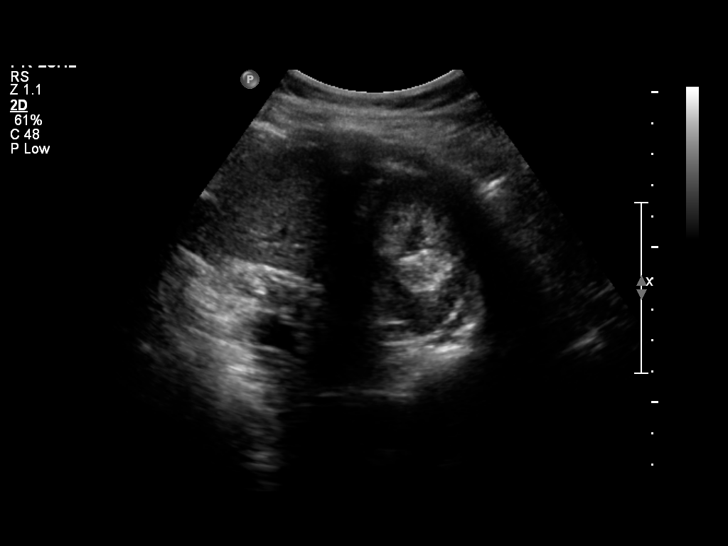
[im 35/42]
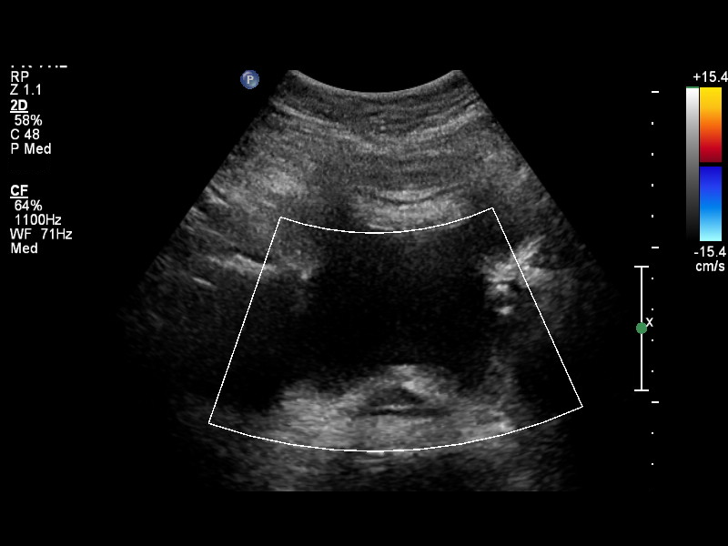
[im 38/42]
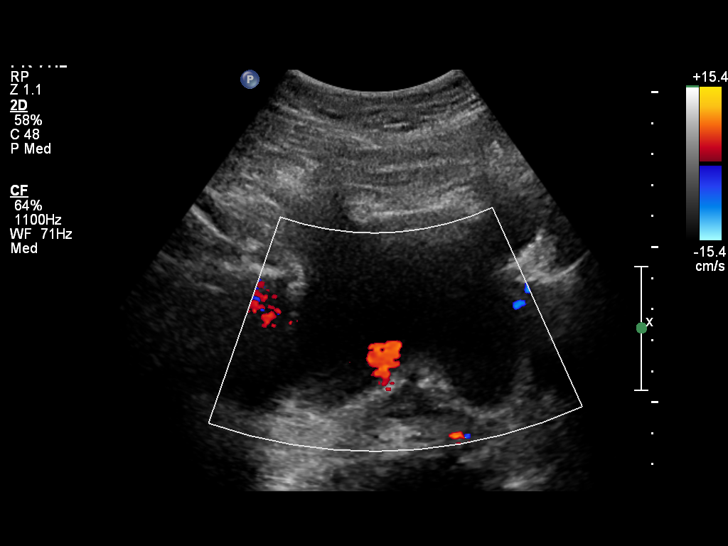
[im 42/42]
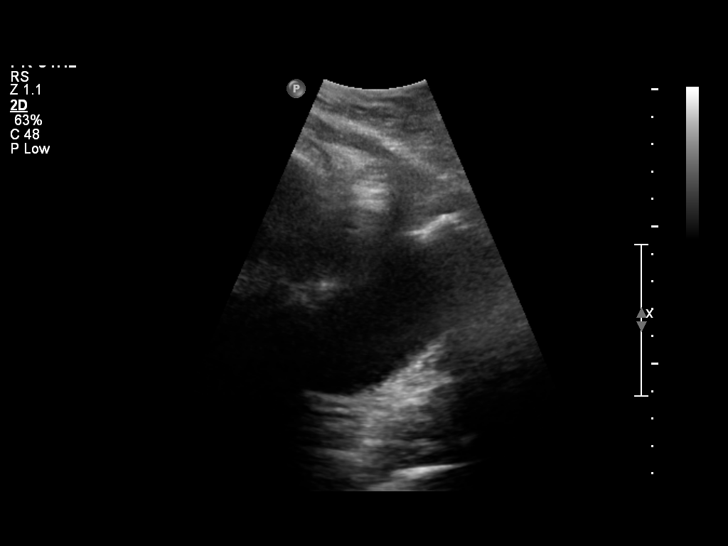

[14 of 25 positions shown; findings below may reference images not displayed]

FINDINGS: Right Kidney:  Right kidney measures 11.3 cm in length.  Normal in
contour and echogenicity.  Hydronephrosis of the right kidney has
significantly decreased compared to the ultrasound of 10/21/2010.
Currently, hydronephrosis is minimal, and consistent with a third
trimester pregnancy.  No renal mass is identified.

Left Kidney:  Left kidney measures 10.8 cm in length and is normal
in echogenicity and contour.  There is no hydronephrosis.  No renal
mass is identified.

Bladder:  Urinary bladder is within normal limits for the degree of
distention.  Bilateral ureteral jets are seen.

Gravid uterus incidentally noted.
IMPRESSION: 1.  Significant improvement in right-sided hydronephrosis compared
to the ultrasound of 10/21/2010.  Currently, hydronephrosis is
minimal, and likely related to pregnancy.
2.  Normal left kidney.

## 2012-12-12 ENCOUNTER — Ambulatory Visit: Payer: Self-pay | Admitting: Family Medicine

## 2012-12-12 VITALS — BP 112/70 | HR 98 | Temp 98.3°F | Resp 18 | Ht <= 58 in | Wt 152.0 lb

## 2012-12-12 DIAGNOSIS — B029 Zoster without complications: Secondary | ICD-10-CM

## 2012-12-12 DIAGNOSIS — M545 Low back pain: Secondary | ICD-10-CM

## 2012-12-12 LAB — GLUCOSE, POCT (MANUAL RESULT ENTRY): POC Glucose: 103 mg/dL — AB (ref 70–99)

## 2012-12-12 MED ORDER — GABAPENTIN 300 MG PO CAPS: 300.0000 mg | ORAL_CAPSULE | Freq: Three times a day (TID) | ORAL | Status: DC | PRN

## 2012-12-12 MED ORDER — PREDNISONE 20 MG PO TABS: ORAL_TABLET | ORAL | Status: DC

## 2012-12-12 MED ORDER — ACYCLOVIR 800 MG PO TABS: 800.0000 mg | ORAL_TABLET | Freq: Every day | ORAL | Status: DC

## 2012-12-12 NOTE — Progress Notes (Signed)
[address]   [place], [place]  [address]   [phone]  Subjective:    [name], female    DOB: 1990/05/08, 22 y.o.   MRN: [id number]  HPI This 22 y.o. female presents for evaluation of lower back pain.  ONset four days ago.  Located L side.  +radiation into leg.  Has rash.  Burning rash.  When someone touches skin, has severe pain.  +diarrhea this week; no n/v.  No fever/chills/sweats.  Review of Systems  Constitutional: Negative for fever, chills, diaphoresis and fatigue.  Gastrointestinal: Positive for abdominal pain and diarrhea. Negative for nausea, vomiting and abdominal distention.  Musculoskeletal: Positive for myalgias and back pain.  Skin: Positive for rash.  Neurological: Positive for numbness. Negative for weakness.   History reviewed. No pertinent past medical history. History reviewed. No pertinent past surgical history. Allergies  Allergen Reactions  . Latex    No current outpatient prescriptions on file prior to visit.   No current facility-administered medications on file prior to visit.   History   Social History  . Marital Status: Married    Spouse Name: N/A    Number of Children: N/A  . Years of Education: N/A   Occupational History  . Not on file.   Social History Main Topics  . Smoking status: Never Smoker   . Smokeless tobacco: Not on file  . Alcohol Use: No  . Drug Use: No  . Sexual Activity: Not on file   Other Topics Concern  . Not on file   Social History Narrative   Marital status: married; from [place] moved to [place] with parents age 28.      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      Tobacco: none      Alcohol:  none       Objective:   Physical Exam  Nursing note and vitals reviewed. Constitutional: She appears well-developed and well-nourished. No distress.  Cardiovascular: Normal rate, regular rhythm, normal heart sounds and intact distal  pulses.  Exam reveals no gallop and no friction rub.   No murmur heard. Pulmonary/Chest: Effort normal and breath sounds normal. She has no wheezes. She has no rales.  Abdominal: Soft. Bowel sounds are normal. She exhibits no distension. There is no tenderness. There is no rebound and no guarding.  Musculoskeletal:       Lumbar back: She exhibits decreased range of motion and pain. She exhibits no tenderness, no bony tenderness and no spasm.  Skin: Skin is warm and dry. Rash noted. She is not diaphoretic.     Vesicular rash in clusters along L thoracic region and upper abdomen.  Vesicles stop at midline.  Psychiatric: She has a normal mood and affect. Her behavior is normal.   Results for orders placed in visit on 12/12/12  GLUCOSE, POCT (MANUAL RESULT ENTRY)      Result Value Range   POC Glucose 103 (*) 70 - 99 mg/dl       Assessment & Plan:  Low back pain  Herpes zoster - Plan: POCT glucose (manual entry)  1.  Low back pain:  New. Secondary to acute herpes zoster infection. 2. Herpes Zoster L:  New.  Rx for Acyclovir, Prednisone, Gabapentin.  Advised to avoid Neurontin during daytime due to hypersomnolence.   Meds ordered this encounter  Medications  . medroxyPROGESTERone (DEPO-PROVERA) 150 MG/ML injection  Sig: Inject 150 mg into the muscle every 3 (three) months.  . predniSONE (DELTASONE) 20 MG tablet    Sig: Three tablets daily x 1 day then two tablets daily x 5 days then one tablet daily x 5 days    Dispense:  18 tablet    Refill:  0  . acyclovir (ZOVIRAX) 800 MG tablet    Sig: Take 1 tablet (800 mg total) by mouth 5 (five) times daily.    Dispense:  35 tablet    Refill:  0  . gabapentin (NEURONTIN) 300 MG capsule    Sig: Take 1 capsule (300 mg total) by mouth 3 (three) times daily as needed.    Dispense:  40 capsule    Refill:  3

## 2012-12-12 NOTE — Patient Instructions (Addendum)
Culebrilla  (Shingles)   La culebrilla (herpes zoster) es una infección causada por el mismo virus que causa la varicela (varicela). La infección causa una erupción y ampollas dolorosas llenas de líquido en la piel, las cuales se abren, forman costra y luego se curan. Puede ocurrir en cualquier área del cuerpo, pero por lo general afecta sólo un lado del cuerpo o del rostro. El dolor de la culebrilla suele durar alrededor de 1 mes. Sin embargo, algunas personas con herpes pueden desarrollar dolor de larga duración (crónico) en la zona del cuerpo afectada.   La culebrilla suele ocurrir muchos años después de que la persona tuvo varicela. Es más frecuente:   · En personas mayores de 50 años.  · En los que tienen el sistema inmunológico debilitado, como en los enfermos con VIH, sida o cáncer.  · En pacientes que toman medicamentos que debilitan el sistema inmunológico, como los medicamentos que se indican en caso de transplantes.  · En personas sometidas a un gran estrés.  CAUSAS   La causa de la culebrilla es el virus varicela zoster (VZV), que también causa la varicela. Después de que una persona se infecta con el virus, este puede permanecer en su cuerpo durante años en un estado inactivo (latente). Para causar la culebrilla, el virus se reactiva e irrumpe como una infección en una raíz nerviosa.   El virus se puede transmitir de persona a persona (es contagioso) a través del contacto con las ampollas abiertas. Sólo se contagiará a las personas que no han padecido varicela. Cuando estas personas tienen exposición al virus, pueden sufrir varicela. No van a desarrollar culebrilla. Una vez que las ampollas cicatrizan, la persona ya no contagia y no puede transmitir el virus a otras personas.   SÍNTOMAS   La culebrilla aparece en etapas. Los síntomas iniciales pueden ser dolor, picazón y sensación de hormigueo en un área de la piel. Este dolor se describe generalmente como ardor, punzante o pulsátil. En unos pocos días  o semanas aparece una erupción roja dolorosa en el área donde se sintió dolor, picazón y hormigueo. La erupción generalmente aparece en un lado del cuerpo en un patrón de bandas o semejante a una correa. Luego la erupción por lo general se convierte en un grupo de ampollas llenas de líquido. Estas ampollas forman una costra y se secan en aproximadamente 2 a 3 semanas.   Con los síntomas iniciales, la erupción o las ampollas también puede haber síntomas similares a la gripe. Estos pueden ser:   · Fiebre.  · Escalofríos.  · Dolor de cabeza.  · Malestar estomacal.  DIAGNÓSTICO   El médico le hará un examen de la piel para diagnosticar la culebrilla. También podrán tomarle muestras de piel o de fluido de las ampollas. Esta muestra se examina bajo el microscopio o se envía al laboratorio para su análisis posterior.   TRATAMIENTO   No existe un tratamiento específico para la culebrilla. El médico le recetará medicamentos para controlar el dolor, recuperarse más rápido y evitar problemas a largo plazo. Puede incluir medicamentos antivirales, anti-inflamatorios y analgésicos.   INSTRUCCIONES PARA EL CUIDADO EN EL HOGAR   · Tome un baño de agua fría o aplique compresas frías en el área de la erupción o ampollas según lo indicado. Esto disminuirá el dolor y la picazón.    · Tome sólo medicamentos de venta libre o recetados, según las indicaciones del médico.    · Haga reposo según las indicaciones del médico.  · Mantenga la erupción y   las ampollas limpias con jabón suave y agua fresca o como lo indique su médico.    · No pellizque las ampollas ni se rasque en la zona de la erupción. Aplique una crema para calmar la picazón o cremas anestésicas en el área afectada según las indicaciones de su médico.  · Mantenga la erupción cubierta con un vendaje suelto (apósito).  · Evite el contacto con:  · Bebés.    · Mujeres embarazadas.  · Niños con eczema.    · Personas mayores que han recibido un transplante.    · Personas con  enfermedades crónicas, como leucemia y sida.  · Use ropa holgada para ayudar a aliviar el dolor del roce con la erupción.  · Concurra puntualmente a las citas de control con el médico. Si el área afectada está en el rostro, podrán derivarlo a un especialista, como un médico especialista en ojos (oftalmólogo) o en oído, nariz y garganta (otorrinolaringólogo). Cumplir con todas las citas de seguimiento lo ayudará a evitar complicaciones en los ojos, el dolor crónico o la discapacidad.  SOLICITE ATENCIÓN MÉDICA DE INMEDIATO SI:   · Tiene dolor en el rostro, en la zona de los ojos o tiene pérdida de sensibilidad en un lado del rostro.  · Siente dolor o zumbido en el oído.  · Tiene pérdida del gusto.  · El dolor no se alivia con los medicamentos prescritos.    · La irritación o la hinchazón se extienden.    · Tiene más dolor e inflamación.    · La afección empeora o ha cambiado.  · Tiene fiebre o síntomas persistentes por más de 2 a 3 días.  · Tiene fiebre y los síntomas empeoran repentinamente.  ASEGÚRESE DE QUE:   · Comprende estas instrucciones.  · Controlará su enfermedad.  · Solicitará ayuda de inmediato si no mejora o si empeora.  Document Released: 01/19/2005 Document Revised: 01/04/2012  Exit[employer]® Patient Information ©2014 [employer].

## 2014-02-24 ENCOUNTER — Encounter (HOSPITAL_COMMUNITY): Payer: Self-pay | Admitting: *Deleted

## 2016-11-28 LAB — OB RESULTS CONSOLE GC/CHLAMYDIA
CHLAMYDIA, DNA PROBE: NEGATIVE
GC PROBE AMP, GENITAL: NEGATIVE

## 2016-11-28 LAB — OB RESULTS CONSOLE RPR: RPR: NONREACTIVE

## 2016-11-28 LAB — OB RESULTS CONSOLE RUBELLA ANTIBODY, IGM: RUBELLA: IMMUNE

## 2016-11-28 LAB — OB RESULTS CONSOLE HEPATITIS B SURFACE ANTIGEN: HEP B S AG: NEGATIVE

## 2016-11-28 LAB — OB RESULTS CONSOLE HIV ANTIBODY (ROUTINE TESTING): HIV: NONREACTIVE

## 2017-04-01 ENCOUNTER — Encounter (HOSPITAL_COMMUNITY): Payer: Self-pay | Admitting: Emergency Medicine

## 2017-04-01 ENCOUNTER — Emergency Department (HOSPITAL_COMMUNITY)
Admission: EM | Admit: 2017-04-01 | Discharge: 2017-04-01 | Disposition: A | Discharge status: Home / Self Care | Payer: Self-pay | Attending: Emergency Medicine | Admitting: Emergency Medicine

## 2017-04-01 ENCOUNTER — Other Ambulatory Visit: Payer: Self-pay

## 2017-04-01 DIAGNOSIS — O479 False labor, unspecified: Secondary | ICD-10-CM

## 2017-04-01 DIAGNOSIS — Z3A35 35 weeks gestation of pregnancy: Secondary | ICD-10-CM | POA: Insufficient documentation

## 2017-04-01 LAB — URINALYSIS, ROUTINE W REFLEX MICROSCOPIC
Bacteria, UA: NONE SEEN
Bilirubin Urine: NEGATIVE
GLUCOSE, UA: NEGATIVE mg/dL
Hgb urine dipstick: NEGATIVE
KETONES UR: 5 mg/dL — AB
NITRITE: NEGATIVE
Protein, ur: NEGATIVE mg/dL
SPECIFIC GRAVITY, URINE: 1.005 (ref 1.005–1.030)
pH: 7 (ref 5.0–8.0)

## 2017-04-01 MED ORDER — SODIUM CHLORIDE 0.9 % IV BOLUS (SEPSIS)
1000.0000 mL | Freq: Once | INTRAVENOUS | Status: AC
  Administered 2017-04-01: 1000 mL via INTRAVENOUS

## 2017-04-01 MED ORDER — NIFEDIPINE 10 MG PO CAPS
20.0000 mg | ORAL_CAPSULE | Freq: Once | ORAL | Status: AC
  Administered 2017-04-01: 20 mg via ORAL
  Filled 2017-04-01: qty 2

## 2017-04-01 NOTE — ED Triage Notes (Signed)
Pt is [redacted] weeks pregnant with "tightening in abd and back pain every 20 minutes" 2nd pregnancy, [facility]

## 2017-04-01 NOTE — Progress Notes (Signed)
Pt is OB cleared. She has been [facility] to [facility]et[facility] complaints and to keep her appointment  [facility] the [facility] de[facility] on 04/06/2017. ED staff notified.

## 2017-04-01 NOTE — ED Provider Notes (Signed)
Please see previous physicians note regarding patient's presenting history and physical, initial ED course, and associated medical decision making.  In short, this is a 26 year old female G2 P1 at 9435 weeks gestational age who presents with contractions. After IV fluids, and repeat dose of Procardia, contractions had ceased.  Spoke with [facility] nurse who states that patient is safe for discharge home per Dr. Emelda FearFerguson.  She has follow-up appointment on Thursday.  Strict return and follow-up instructions reviewed. She expressed understanding of all discharge instructions and felt comfortable with the plan of care.    Lavera GuiseLiu, Aaren Atallah Duo, MD 04/01/17 1726

## 2017-04-01 NOTE — Progress Notes (Signed)
Pt is a G2P1 at 35 1/[redacted] weeks gestation here today with c/o uc's. [facility] [place]. She denies vaginal bleeding or leaking of fluid. Denies any problems with this pregnancy. Says she was induced 3 weeks Ashir Kunz with her 1st pregnancy due to high blood pressure. Denies headache, blurred vision, spots before her eyes. No epigastric pain. Blood pressures are nl.

## 2017-04-01 NOTE — ED Provider Notes (Signed)
[facility] EMERGENCY [facility]PARTMENT Provider Note   CSN: [id number] Arrival date & time: 04/01/17  1355     History   Chief Complaint Chief Complaint  Patient presents with  . Labor Eval    HPI  Chief complaint is pregnant,? Labor.  26 year old female. G2 P1. Her weight 35 weeks. Had induction due to hypertension with her first child. This was vaginal delivery uncomplicated.  Her prenatal care has been at health department. She is a positive. Awakened this morning feeling occasional contractions in her lower anterior abdomen and back. No passage of blood or fluid. Otherwise, negative pregnancy.  Past Medical History:  Diagnosis Date  . Pyelonephritis, acute    2012 this pregnancy    Patient Active Problem List   Diagnosis Date Noted  . Postpartum care following vaginal delivery 12/08/2010    History reviewed. No pertinent surgical history.  OB History    Gravida Para Term Preterm AB Living   2 1 1  0 0 1   SAB TAB Ectopic Multiple Live Births   0 0 0 0 1       Home Medications    Prior to Admission medications   Medication Sig Start Date End Date Taking? Authorizing Provider  acetaminophen (TYLENOL) 325 MG tablet Take 650 mg by mouth daily as needed for fever or headache (pain).   Yes [provider]  prenatal vitamin w/FE, FA (PRENATAL 1 + 1) 27-1 MG TABS Take 1 tablet by mouth daily.     Yes [provider]    Family History Family History  Problem Relation Age of Onset  . Anesthesia problems Neg Hx   . Hypotension Neg Hx   . Malignant hyperthermia Neg Hx   . Pseudochol deficiency Neg Hx     Social History Social History   Tobacco Use  . Smoking status: Never Smoker  . Smokeless tobacco: Never Used  Substance Use Topics  . Alcohol use: No  . Drug use: No     Allergies   Patient has no known allergies.   Review of Systems Review of Systems  Constitutional: Negative for appetite change, chills,  diaphoresis, fatigue and fever.  HENT: Negative for mouth sores, sore throat and trouble swallowing.   Eyes: Negative for visual disturbance.  Respiratory: Negative for cough, chest tightness, shortness of breath and wheezing.   Cardiovascular: Negative for chest pain.  Gastrointestinal: Positive for abdominal pain. Negative for abdominal distention, diarrhea, nausea and vomiting.  Endocrine: Negative for polydipsia, polyphagia and polyuria.  Genitourinary: Negative for dysuria, frequency and hematuria.  Musculoskeletal: Negative for gait problem.  Skin: Negative for color change, pallor and rash.  Neurological: Negative for dizziness, syncope, light-headedness and headaches.  Hematological: Does not bruise/bleed easily.  Psychiatric/Behavioral: Negative for behavioral problems and confusion.     Physical Exam Updated Vital Signs BP 119/85 (BP Location: Left Arm)   Pulse 83   Temp 98.3 F (36.8 C) (Oral)   Resp 18   LMP  (Exact Date)   SpO2 99%   Physical Exam  Constitutional: She is oriented to person, place, and time. She appears well-developed and well-nourished. No distress.  HENT:  Head: Normocephalic.  Eyes: Conjunctivae are normal. Pupils are equal, round, and reactive to light. No scleral icterus.  Neck: Normal range of motion. Neck supple. No thyromegaly present.  Cardiovascular: Normal rate and regular rhythm. Exam reveals no gallop and no friction rub.  No murmur heard. Pulmonary/Chest: Effort normal and breath sounds normal. No respiratory  distress. She has no wheezes. She has no rales.  Abdominal: Soft. Bowel sounds are normal. She exhibits no distension. There is no tenderness. There is no rebound.  Abdomen gravid, soft.  Musculoskeletal: Normal range of motion.  Neurological: She is alert and oriented to person, place, and time.  Skin: Skin is warm and dry. No rash noted.  Psychiatric: She has a normal mood and affect. Her behavior is normal.     ED  Treatments / Results  Labs (all labs ordered are listed, but only abnormal results are displayed) Labs Reviewed  URINALYSIS, ROUTINE W REFLEX MICROSCOPIC    EKG  EKG Interpretation None       Radiology No results found.  Procedures Procedures (including critical care time)  Medications Ordered in ED Medications  NIFEdipine (PROCARDIA) capsule 20 mg (not administered)  sodium chloride 0.9 % bolus 1,000 mL (1,000 mLs Intravenous New Bag/Given 04/01/17 1501)     Initial Impression / Assessment and Plan / ED Course  I have reviewed the triage vital signs and the nursing notes.  Pertinent labs & imaging results that were available during my care of the patient were reviewed by me and considered in my medical decision making (see chart for details).  Patient having occasional brief contractions. Given IV fluids. These have decreased but not resolved. Seen by [facility] nurse. Cervical exam is normal. Urinalysis pending. Care discussed with Dr. Emelda FearFerguson by [facility]. Plan will be Procardia now. Repeat 1 hour. He still contracting may require admission. If symptoms resolve may be candidate for discharge. No signs of active organize labor at this time.  Would be appropriate for transfer under EMTALA  Final Clinical Impressions(s) / ED Diagnoses   Final diagnoses:  Uterine contractions during pregnancy    ED Discharge Orders    None       Rolland PorterJames, Marjan Rosman, MD 04/01/17 1544

## 2017-04-01 NOTE — ED Notes (Signed)
[facility] RN at bedside with patient.

## 2017-04-01 NOTE — Progress Notes (Signed)
Spoke with Dr. Emelda FearFerguson. Pt is a G2P1 at 35 1/[redacted] weeks gestation  Here with c/o uc's. Pt has had a liter of IVF, has emptied her bladder, and a urine has been sent. Pt's cervix is closed, thk,presenting part high. No vaginal bleeding or leaking of fluid. FHR tracing is a category 1 with uc every 6min. Orders received for procardia 20mg  po now and if she is still contracting in an hour after that dose, procardia 10mg  po.

## 2017-04-01 NOTE — Discharge Instructions (Signed)
Please [facility] on Thursday as scheduled.  [facility] if recurrent regular contracts, leakage of fluids, vaginal bleeding or any other symptoms concerning to you

## 2017-04-06 LAB — OB RESULTS CONSOLE GBS: GBS: NEGATIVE

## 2017-05-03 ENCOUNTER — Encounter (HOSPITAL_COMMUNITY): Payer: Self-pay

## 2017-05-03 ENCOUNTER — Encounter (HOSPITAL_COMMUNITY)
Admission: AD | Disposition: A | Discharge status: Home / Self Care | Payer: Self-pay | Source: Ambulatory Visit | Attending: Obstetrics and Gynecology

## 2017-05-03 ENCOUNTER — Inpatient Hospital Stay (HOSPITAL_COMMUNITY): Payer: Medicaid Other | Admitting: Anesthesiology

## 2017-05-03 ENCOUNTER — Inpatient Hospital Stay (HOSPITAL_COMMUNITY)
Admission: AD | Admit: 2017-05-03 | Discharge: 2017-05-05 | DRG: 787 | Disposition: A | Discharge status: Home / Self Care | Payer: Medicaid Other | Source: Ambulatory Visit | Attending: Obstetrics and Gynecology | Admitting: Obstetrics and Gynecology

## 2017-05-03 ENCOUNTER — Other Ambulatory Visit: Payer: Self-pay

## 2017-05-03 DIAGNOSIS — Z3483 Encounter for supervision of other normal pregnancy, third trimester: Secondary | ICD-10-CM | POA: Diagnosis present

## 2017-05-03 DIAGNOSIS — D649 Anemia, unspecified: Secondary | ICD-10-CM | POA: Diagnosis present

## 2017-05-03 DIAGNOSIS — E669 Obesity, unspecified: Secondary | ICD-10-CM | POA: Diagnosis present

## 2017-05-03 DIAGNOSIS — O9902 Anemia complicating childbirth: Secondary | ICD-10-CM | POA: Diagnosis present

## 2017-05-03 DIAGNOSIS — Z3490 Encounter for supervision of normal pregnancy, unspecified, unspecified trimester: Secondary | ICD-10-CM

## 2017-05-03 DIAGNOSIS — Z3A39 39 weeks gestation of pregnancy: Secondary | ICD-10-CM | POA: Diagnosis not present

## 2017-05-03 DIAGNOSIS — O99214 Obesity complicating childbirth: Secondary | ICD-10-CM | POA: Diagnosis present

## 2017-05-03 DIAGNOSIS — O339 Maternal care for disproportion, unspecified: Secondary | ICD-10-CM | POA: Diagnosis present

## 2017-05-03 HISTORY — DX: Gestational (pregnancy-induced) hypertension without significant proteinuria, unspecified trimester: O13.9

## 2017-05-03 LAB — TYPE AND SCREEN
ABO/RH(D): A POS
Antibody Screen: NEGATIVE

## 2017-05-03 LAB — CBC
HCT: 33.3 % — ABNORMAL LOW (ref 36.0–46.0)
HEMOGLOBIN: 11.3 g/dL — AB (ref 12.0–15.0)
MCH: 28.4 pg (ref 26.0–34.0)
MCHC: 33.9 g/dL (ref 30.0–36.0)
MCV: 83.7 fL (ref 78.0–100.0)
Platelets: 272 10*3/uL (ref 150–400)
RBC: 3.98 MIL/uL (ref 3.87–5.11)
RDW: 14 % (ref 11.5–15.5)
WBC: 15.5 10*3/uL — ABNORMAL HIGH (ref 4.0–10.5)

## 2017-05-03 LAB — ABO/RH: ABO/RH(D): A POS

## 2017-05-03 SURGERY — Surgical Case
Anesthesia: Epidural

## 2017-05-03 MED ORDER — MORPHINE SULFATE (PF) 0.5 MG/ML IJ SOLN
INTRAMUSCULAR | Status: AC
  Filled 2017-05-03: qty 10

## 2017-05-03 MED ORDER — FENTANYL CITRATE (PF) 100 MCG/2ML IJ SOLN
INTRAMUSCULAR | Status: DC | PRN
  Administered 2017-05-03: 100 ug via EPIDURAL

## 2017-05-03 MED ORDER — MORPHINE SULFATE (PF) 0.5 MG/ML IJ SOLN
INTRAMUSCULAR | Status: DC | PRN
  Administered 2017-05-03: 3 mg via EPIDURAL

## 2017-05-03 MED ORDER — OXYCODONE HCL 5 MG PO TABS
5.0000 mg | ORAL_TABLET | Freq: Once | ORAL | Status: DC | PRN
Start: 2017-05-03 — End: 2017-05-04

## 2017-05-03 MED ORDER — BUPIVACAINE HCL (PF) 0.25 % IJ SOLN
INTRAMUSCULAR | Status: DC | PRN
  Administered 2017-05-03: 5 mL/h via EPIDURAL

## 2017-05-03 MED ORDER — LIDOCAINE-EPINEPHRINE (PF) 2 %-1:200000 IJ SOLN
INTRAMUSCULAR | Status: DC | PRN
  Administered 2017-05-03: 10 mL via EPIDURAL
  Administered 2017-05-03: 5 mL via EPIDURAL

## 2017-05-03 MED ORDER — OXYCODONE-ACETAMINOPHEN 5-325 MG PO TABS: 2.0000 | ORAL_TABLET | ORAL | Status: DC | PRN

## 2017-05-03 MED ORDER — LIDOCAINE-EPINEPHRINE (PF) 2 %-1:200000 IJ SOLN
INTRAMUSCULAR | Status: AC
  Filled 2017-05-03: qty 20

## 2017-05-03 MED ORDER — LIDOCAINE-EPINEPHRINE 2 %-1:100000 IJ SOLN
INTRAMUSCULAR | Status: DC | PRN
  Administered 2017-05-03: 6 mL

## 2017-05-03 MED ORDER — OXYTOCIN 40 UNITS IN LACTATED RINGERS INFUSION - SIMPLE MED
1.0000 m[IU]/min | INTRAVENOUS | Status: DC
  Administered 2017-05-03: 2 m[IU]/min via INTRAVENOUS
  Filled 2017-05-03: qty 1000

## 2017-05-03 MED ORDER — LACTATED RINGERS IV SOLN
INTRAVENOUS | Status: DC
  Administered 2017-05-03: 09:00:00 via INTRAVENOUS

## 2017-05-03 MED ORDER — PHENYLEPHRINE 40 MCG/ML (10ML) SYRINGE FOR IV PUSH (FOR BLOOD PRESSURE SUPPORT)
80.0000 ug | PREFILLED_SYRINGE | INTRAVENOUS | Status: DC | PRN
  Filled 2017-05-03 (×2): qty 10

## 2017-05-03 MED ORDER — OXYTOCIN 10 UNIT/ML IJ SOLN
INTRAMUSCULAR | Status: AC
  Filled 2017-05-03: qty 4

## 2017-05-03 MED ORDER — CEFAZOLIN SODIUM-DEXTROSE 2-3 GM-%(50ML) IV SOLR
INTRAVENOUS | Status: DC | PRN
  Administered 2017-05-03: 2 g via INTRAVENOUS

## 2017-05-03 MED ORDER — ACETAMINOPHEN 500 MG PO TABS
1000.0000 mg | ORAL_TABLET | Freq: Once | ORAL | Status: AC
  Administered 2017-05-03: 1000 mg via ORAL
  Filled 2017-05-03: qty 2

## 2017-05-03 MED ORDER — PHENYLEPHRINE HCL 10 MG/ML IJ SOLN
INTRAMUSCULAR | Status: DC | PRN
  Administered 2017-05-03: 40 ug via INTRAVENOUS

## 2017-05-03 MED ORDER — SCOPOLAMINE 1 MG/3DAYS TD PT72
MEDICATED_PATCH | TRANSDERMAL | Status: DC | PRN
  Administered 2017-05-03: 1 via TRANSDERMAL

## 2017-05-03 MED ORDER — MEPERIDINE HCL 25 MG/ML IJ SOLN: 6.2500 mg | INTRAMUSCULAR | Status: DC | PRN

## 2017-05-03 MED ORDER — FENTANYL CITRATE (PF) 100 MCG/2ML IJ SOLN
25.0000 ug | INTRAMUSCULAR | Status: DC | PRN
  Administered 2017-05-04: 25 ug via INTRAVENOUS

## 2017-05-03 MED ORDER — ONDANSETRON HCL 4 MG/2ML IJ SOLN
INTRAMUSCULAR | Status: AC
  Filled 2017-05-03: qty 2

## 2017-05-03 MED ORDER — DIPHENHYDRAMINE HCL 50 MG/ML IJ SOLN: 12.5000 mg | INTRAMUSCULAR | Status: DC | PRN

## 2017-05-03 MED ORDER — LACTATED RINGERS IV SOLN
500.0000 mL | Freq: Once | INTRAVENOUS | Status: AC
  Administered 2017-05-03: 500 mL via INTRAVENOUS

## 2017-05-03 MED ORDER — LACTATED RINGERS IV SOLN
INTRAVENOUS | Status: DC | PRN
  Administered 2017-05-03 (×2): via INTRAVENOUS

## 2017-05-03 MED ORDER — EPHEDRINE 5 MG/ML INJ: 10.0000 mg | INTRAVENOUS | Status: DC | PRN

## 2017-05-03 MED ORDER — SCOPOLAMINE 1 MG/3DAYS TD PT72
MEDICATED_PATCH | TRANSDERMAL | Status: AC
  Filled 2017-05-03: qty 1

## 2017-05-03 MED ORDER — AMPICILLIN SODIUM 2 G IJ SOLR
2.0000 g | Freq: Four times a day (QID) | INTRAMUSCULAR | Status: DC
  Administered 2017-05-03: 2 g via INTRAVENOUS
  Filled 2017-05-03 (×4): qty 2000

## 2017-05-03 MED ORDER — FENTANYL 2.5 MCG/ML BUPIVACAINE 1/10 % EPIDURAL INFUSION (WH - ANES)
11.0000 mL/h | INTRAMUSCULAR | Status: DC | PRN
  Administered 2017-05-03 (×2): 11 mL/h via EPIDURAL
  Filled 2017-05-03 (×2): qty 100

## 2017-05-03 MED ORDER — GENTAMICIN SULFATE 40 MG/ML IJ SOLN: 5.0000 mg/kg | INTRAVENOUS | Status: DC

## 2017-05-03 MED ORDER — TERBUTALINE SULFATE 1 MG/ML IJ SOLN: 0.2500 mg | Freq: Once | INTRAMUSCULAR | Status: DC | PRN

## 2017-05-03 MED ORDER — CEFAZOLIN SODIUM-DEXTROSE 2-4 GM/100ML-% IV SOLN
INTRAVENOUS | Status: AC
  Filled 2017-05-03: qty 100

## 2017-05-03 MED ORDER — FENTANYL CITRATE (PF) 100 MCG/2ML IJ SOLN
100.0000 ug | INTRAMUSCULAR | Status: DC | PRN
  Administered 2017-05-03: 100 ug via INTRAVENOUS
  Filled 2017-05-03 (×2): qty 2

## 2017-05-03 MED ORDER — OXYTOCIN 40 UNITS IN LACTATED RINGERS INFUSION - SIMPLE MED: 2.5000 [IU]/h | INTRAVENOUS | Status: DC

## 2017-05-03 MED ORDER — OXYCODONE HCL 5 MG/5ML PO SOLN: 5.0000 mg | Freq: Once | ORAL | Status: DC | PRN

## 2017-05-03 MED ORDER — LIDOCAINE HCL (PF) 1 % IJ SOLN
INTRAMUSCULAR | Status: DC | PRN
  Administered 2017-05-03: 6 mL via EPIDURAL
  Administered 2017-05-03: 4 mL via EPIDURAL

## 2017-05-03 MED ORDER — SODIUM BICARBONATE 8.4 % IV SOLN
INTRAVENOUS | Status: AC
  Filled 2017-05-03: qty 50

## 2017-05-03 MED ORDER — ACETAMINOPHEN 325 MG PO TABS: 650.0000 mg | ORAL_TABLET | ORAL | Status: DC | PRN

## 2017-05-03 MED ORDER — GENTAMICIN SULFATE 40 MG/ML IJ SOLN
5.0000 mg/kg | INTRAMUSCULAR | Status: DC
Start: 2017-05-03 — End: 2017-05-04
  Administered 2017-05-03: 270 mg via INTRAVENOUS
  Filled 2017-05-03: qty 6.75

## 2017-05-03 MED ORDER — LIDOCAINE HCL (PF) 1 % IJ SOLN: 30.0000 mL | INTRAMUSCULAR | Status: DC | PRN

## 2017-05-03 MED ORDER — ACETAMINOPHEN 160 MG/5ML PO SOLN: 325.0000 mg | ORAL | Status: DC | PRN

## 2017-05-03 MED ORDER — OXYTOCIN BOLUS FROM INFUSION: 500.0000 mL | Freq: Once | INTRAVENOUS | Status: DC

## 2017-05-03 MED ORDER — KETOROLAC TROMETHAMINE 30 MG/ML IJ SOLN: 30.0000 mg | Freq: Once | INTRAMUSCULAR | Status: DC | PRN

## 2017-05-03 MED ORDER — DEXAMETHASONE SODIUM PHOSPHATE 10 MG/ML IJ SOLN
INTRAMUSCULAR | Status: AC
  Filled 2017-05-03: qty 1

## 2017-05-03 MED ORDER — MEPERIDINE HCL 25 MG/ML IJ SOLN
INTRAMUSCULAR | Status: AC
  Filled 2017-05-03: qty 1

## 2017-05-03 MED ORDER — OXYCODONE-ACETAMINOPHEN 5-325 MG PO TABS: 1.0000 | ORAL_TABLET | ORAL | Status: DC | PRN

## 2017-05-03 MED ORDER — LACTATED RINGERS IV SOLN: 500.0000 mL | INTRAVENOUS | Status: DC | PRN

## 2017-05-03 MED ORDER — SOD CITRATE-CITRIC ACID 500-334 MG/5ML PO SOLN
30.0000 mL | ORAL | Status: DC | PRN
  Administered 2017-05-03: 30 mL via ORAL
  Filled 2017-05-03: qty 15

## 2017-05-03 MED ORDER — PHENYLEPHRINE 40 MCG/ML (10ML) SYRINGE FOR IV PUSH (FOR BLOOD PRESSURE SUPPORT)
PREFILLED_SYRINGE | INTRAVENOUS | Status: AC
  Filled 2017-05-03: qty 10

## 2017-05-03 MED ORDER — LACTATED RINGERS IV SOLN
INTRAVENOUS | Status: DC | PRN
  Administered 2017-05-03: 22:00:00 via INTRAVENOUS

## 2017-05-03 MED ORDER — SODIUM CHLORIDE 0.9 % IR SOLN
Status: DC | PRN
  Administered 2017-05-03: 2000 mL

## 2017-05-03 MED ORDER — MEPERIDINE HCL 25 MG/ML IJ SOLN
INTRAMUSCULAR | Status: DC | PRN
  Administered 2017-05-03: 12.5 mg via INTRAVENOUS

## 2017-05-03 MED ORDER — ONDANSETRON HCL 4 MG/2ML IJ SOLN: 4.0000 mg | Freq: Once | INTRAMUSCULAR | Status: DC | PRN

## 2017-05-03 MED ORDER — DEXAMETHASONE SODIUM PHOSPHATE 10 MG/ML IJ SOLN
INTRAMUSCULAR | Status: DC | PRN
  Administered 2017-05-03: 10 mg via INTRAVENOUS

## 2017-05-03 MED ORDER — PHENYLEPHRINE 40 MCG/ML (10ML) SYRINGE FOR IV PUSH (FOR BLOOD PRESSURE SUPPORT): 80.0000 ug | PREFILLED_SYRINGE | INTRAVENOUS | Status: DC | PRN

## 2017-05-03 MED ORDER — ONDANSETRON HCL 4 MG/2ML IJ SOLN: 4.0000 mg | Freq: Four times a day (QID) | INTRAMUSCULAR | Status: DC | PRN

## 2017-05-03 MED ORDER — ACETAMINOPHEN 325 MG PO TABS: 325.0000 mg | ORAL_TABLET | ORAL | Status: DC | PRN

## 2017-05-03 MED ORDER — OXYTOCIN 10 UNIT/ML IJ SOLN
INTRAVENOUS | Status: DC | PRN
  Administered 2017-05-03: 40 [IU] via INTRAVENOUS

## 2017-05-03 MED ORDER — ONDANSETRON HCL 4 MG/2ML IJ SOLN
INTRAMUSCULAR | Status: DC | PRN
  Administered 2017-05-03: 4 mg via INTRAVENOUS

## 2017-05-03 SURGICAL SUPPLY — 39 items
BENZOIN TINCTURE PRP APPL 2/3 (GAUZE/BANDAGES/DRESSINGS) ×3 IMPLANT
CHLORAPREP W/TINT 26ML (MISCELLANEOUS) ×3 IMPLANT
CLAMP CORD UMBIL (MISCELLANEOUS) ×3 IMPLANT
CLOSURE WOUND 1/2 X4 (GAUZE/BANDAGES/DRESSINGS) ×1
CLOTH BEACON ORANGE TIMEOUT ST (SAFETY) ×3 IMPLANT
DRSG OPSITE POSTOP 4X10 (GAUZE/BANDAGES/DRESSINGS) ×3 IMPLANT
ELECT REM PT RETURN 9FT ADLT (ELECTROSURGICAL) ×3
ELECTRODE REM PT RTRN 9FT ADLT (ELECTROSURGICAL) ×1 IMPLANT
EXTRACTOR VACUUM KIWI (MISCELLANEOUS) IMPLANT
GLOVE BIO SURGEON ST LM GN SZ9 (GLOVE) IMPLANT
GLOVE BIOGEL PI IND STRL 7.0 (GLOVE) ×5 IMPLANT
GLOVE BIOGEL PI IND STRL 9 (GLOVE) ×1 IMPLANT
GLOVE BIOGEL PI INDICATOR 7.0 (GLOVE) ×10
GLOVE BIOGEL PI INDICATOR 9 (GLOVE) ×2
GLOVE SS PI 9.0 STRL (GLOVE) ×3 IMPLANT
GLOVE SURG SS PI 6.0 STRL IVOR (GLOVE) ×3 IMPLANT
GLOVE SURG SS PI 7.0 STRL IVOR (GLOVE) ×3 IMPLANT
GOWN STRL REUS W/TWL 2XL LVL3 (GOWN DISPOSABLE) ×6 IMPLANT
GOWN STRL REUS W/TWL LRG LVL3 (GOWN DISPOSABLE) ×3 IMPLANT
NEEDLE HYPO 25X5/8 SAFETYGLIDE (NEEDLE) IMPLANT
NS IRRIG 1000ML POUR BTL (IV SOLUTION) ×3 IMPLANT
PACK C SECTION WH (CUSTOM PROCEDURE TRAY) ×3 IMPLANT
PAD OB MATERNITY 4.3X12.25 (PERSONAL CARE ITEMS) ×3 IMPLANT
PENCIL SMOKE EVAC W/HOLSTER (ELECTROSURGICAL) ×3 IMPLANT
RTRCTR C-SECT PINK 25CM LRG (MISCELLANEOUS) ×3 IMPLANT
RTRCTR C-SECT PINK 34CM XLRG (MISCELLANEOUS) IMPLANT
SPONGE LAP 18X18 X RAY DECT (DISPOSABLE) ×3 IMPLANT
STRIP CLOSURE SKIN 1/2X4 (GAUZE/BANDAGES/DRESSINGS) ×2 IMPLANT
SUT MNCRL 0 VIOLET CTX 36 (SUTURE) ×3 IMPLANT
SUT MONOCRYL 0 CTX 36 (SUTURE) ×6
SUT PROLENE 2 0 CT 30 (SUTURE) ×3 IMPLANT
SUT VIC AB 0 CT1 27 (SUTURE) ×2
SUT VIC AB 0 CT1 27XBRD ANBCTR (SUTURE) ×1 IMPLANT
SUT VIC AB 2-0 CT1 27 (SUTURE) ×2
SUT VIC AB 2-0 CT1 TAPERPNT 27 (SUTURE) ×1 IMPLANT
SUT VIC AB 4-0 KS 27 (SUTURE) ×3 IMPLANT
SYR BULB IRRIGATION 50ML (SYRINGE) IMPLANT
TOWEL OR 17X24 6PK STRL BLUE (TOWEL DISPOSABLE) ×3 IMPLANT
TRAY FOLEY BAG SILVER LF 14FR (SET/KITS/TRAYS/PACK) IMPLANT

## 2017-05-03 NOTE — MAU Note (Signed)
Report given to Janalyn Harderhristina Rossi RN

## 2017-05-03 NOTE — Anesthesia Procedure Notes (Signed)
Epidural Patient location during procedure: OB Start time: 05/03/2017 11:24 AM End time: 05/03/2017 11:29 AM  Staffing Anesthesiologist: Beryle LatheBrock, Dareen Gutzwiller E, MD Performed: anesthesiologist   Preanesthetic Checklist Completed: patient identified, pre-op evaluation, timeout performed, IV checked, risks and benefits discussed and monitors and equipment checked  Epidural Patient position: sitting Prep: DuraPrep Patient monitoring: continuous pulse ox and blood pressure Approach: midline Location: L2-L3 Injection technique: LOR saline  Needle:  Needle type: Tuohy  Needle gauge: 17 G Needle length: 9 cm Needle insertion depth: 5 cm Catheter size: 19 Gauge Catheter at skin depth: 10 cm Test dose: negative and Other (1% lidocaine)  Additional Notes Patient identified. Risks including, but not limited to, bleeding, infection, nerve damage, paralysis, inadequate analgesia, blood pressure changes, nausea, vomiting, allergic reaction, postpartum back pain, itching, and headache were discussed. Patient expressed understanding and wished to proceed. Sterile prep and drape, including hand hygiene, mask, and sterile gloves were used. The patient was positioned and the spine was prepped. The skin was anesthetized with lidocaine. No paraesthesia or other complication noted. The patient did not experience any signs of intravascular injection such as tinnitus or metallic taste in mouth, nor signs of intrathecal spread such as rapid motor block. Please see nursing notes for vital signs. The patient tolerated the procedure well.   Amanda Sharp, MDReason for block:procedure for pain

## 2017-05-03 NOTE — Progress Notes (Signed)
Amanda PiggJohana Sharp is a 27 y.o. G2P1001 at 7521w5d by ultrasound admitted for active labor  Subjective: Patient doing well. Reports discomfort with contractions. Patient now with epidural. Per nursing, nurse performed cervical check right before my arrival. Patient was dilated to 6cm at that check.   Objective: BP 122/73   Pulse 87   Temp 98.7 F (37.1 C) (Oral)   Resp 20   Ht 4' 8.5" (1.435 m)   Wt 75.3 kg (166 lb)   BMI 36.56 kg/m  No intake/output data recorded. No intake/output data recorded.  FHT:  FHR: 140 bpm, variability: moderate,  accelerations:  Present,  decelerations:  Present early UC:   regular, every 2 minutes SVE:   Dilation: 5 Effacement (%): 100 Station: -2 Exam by:: H. Koran, RNC  Labs: Lab Results  Component Value Date   WBC 15.5 (H) 05/03/2017   HGB 11.3 (L) 05/03/2017   HCT 33.3 (L) 05/03/2017   MCV 83.7 05/03/2017   PLT 272 05/03/2017    Assessment / Plan: Spontaneous labor, progressing normally  Labor: Progressing normally Fetal Wellbeing:  Category I Pain Control:  Epidural I/D:  GBS neg Anticipated MOD:  NSVD  Amanda Sharp 05/03/2017, 12:35 PM

## 2017-05-03 NOTE — Progress Notes (Signed)
Amanda Sharp is a 27 y.o. G2P1001 at 6216w5d by ultrasound admitted for active labor  Subjective: Patient doing well. States that with epidural she cannot feel her legs stating they are "dead, dead". Appears more comfortable.   Objective: BP 111/72   Pulse 98   Temp 98.7 F (37.1 C) (Oral)   Resp 20   Ht 4' 8.5" (1.435 m)   Wt 75.3 kg (166 lb)   BMI 36.56 kg/m  No intake/output data recorded. No intake/output data recorded.  FHT:  FHR: 140 bpm, variability: moderate,  accelerations:  Present,  decelerations:  Present early UC:   irregular, every 2-3 minutes SVE:   Dilation: 7 Effacement (%): 100 Station: -1, 0 Exam by:: Amanda Dy. Tucker, RN  Labs: Lab Results  Component Value Date   WBC 15.5 (H) 05/03/2017   HGB 11.3 (L) 05/03/2017   HCT 33.3 (L) 05/03/2017   MCV 83.7 05/03/2017   PLT 272 05/03/2017    Assessment / Plan: Spontaneous labor, progressing normally  Labor: Progressing normally, IUPC placed by Dr. Rachelle Sharp  Fetal Wellbeing:  Category I Pain Control:  Epidural I/D:  GBS neg Anticipated MOD:  NSVD  Amanda Sharp 05/03/2017, 3:59 PM

## 2017-05-03 NOTE — MAU Note (Signed)
Pt reports contractions for 2 days. Uc's have been every 5 minutes for 1.5 hours. Denies LOF

## 2017-05-03 NOTE — Progress Notes (Signed)
Subjective: Saw patient with Dr. Rachelle HoraMoss. Per ultrasound read patient was OP. Dr. Rachelle HoraMoss manually rotated head from OP to OA.   Objective: BP 133/84   Pulse 98   Temp (!) 100.4 F (38 C) (Oral)   Resp 20   Ht 4' 8.5" (1.435 m)   Wt 75.3 kg (166 lb)   BMI 36.56 kg/m  No intake/output data recorded. No intake/output data recorded.  SVE:   Dilation: 7 Effacement (%): 100 Station: 0 Exam by:: H. Koran, RNC  Labs: Lab Results  Component Value Date   WBC 15.5 (H) 05/03/2017   HGB 11.3 (L) 05/03/2017   HCT 33.3 (L) 05/03/2017   MCV 83.7 05/03/2017   PLT 272 05/03/2017    Assessment / Plan: Protracted active phase  Labor: Will continue pitocin Pain Control:  Epidural I/D:  amp/gent Anticipated MOD:  NSVD  Amanda Sharp 05/03/2017, 7:38 PM

## 2017-05-03 NOTE — Transfer of Care (Signed)
Immediate Anesthesia Transfer of Care Note  Patient: Rayetta PiggJohana Melara  Procedure(s) Performed: CESAREAN SECTION (N/A )  Patient Location: PACU  Anesthesia Type:Regional  Level of Consciousness: awake  Airway & Oxygen Therapy: Patient Spontanous Breathing  Post-op Assessment: Post -op Vital signs reviewed and stable  Post vital signs: stable  Last Vitals:  Vitals:   05/03/17 2130 05/03/17 2156  BP: (!) 116/53 102/62  Pulse: (!) 148 (!) 128  Resp: 16 14  Temp:      Last Pain:  Vitals:   05/03/17 2156  TempSrc:   PainSc: 0-No pain      Patients Stated Pain Goal: 3 (05/03/17 0951)  Complications: No apparent anesthesia complications

## 2017-05-03 NOTE — Anesthesia Pain Management Evaluation Note (Signed)
  CRNA Pain Management Visit Note  Patient: Amanda Sharp, 27 y.o., female  "Hello I am a member of the anesthesia team at [facility]. We have an anesthesia team available at all times to provide care throughout the hospital, including epidural management and anesthesia for C-section. I don't know your plan for the delivery whether it a natural birth, water birth, IV sedation, nitrous supplementation, doula or epidural, but we want to meet your pain goals."   1.Was your pain managed to your expectations on prior hospitalizations?   Yes   2.What is your expectation for pain management during this hospitalization?     Epidural  3.How can we help you reach that goal? unsure  Record the patient's initial score and the patient's pain goal.   Pain: 0  Pain Goal: 10 [facility] wants you to be able to say your pain was always managed very well.  Cephus ShellingBURGER,Jasdeep Kepner 05/03/2017

## 2017-05-03 NOTE — Op Note (Signed)
[name] PROCEDURE DATE: 05/03/2017  PREOPERATIVE DIAGNOSES: Intrauterine pregnancy at [redacted]w[redacted]d weeks gestation; cephalo-pelvic disproportion and failure to progress: arrest of dilation  POSTOPERATIVE DIAGNOSES: The same  PROCEDURE: Primary Low Transverse Cesarean Section  SURGEON, primary:  Christin Bach, MD SURGEON, fellow: Rolm Bookbinder, DO  ANESTHESIOLOGY TEAM: Anesthesiologist: Beryle Lathe, MD; Bethena Midget, MD CRNA: Jennye Boroughs, CRNA; Orlie Pollen, CRNA  INDICATIONS: Amanda Sharp is a 27 y.o. G2P1001 at [redacted]w[redacted]d here for cesarean section secondary to the indications listed under preoperative diagnoses; please see preoperative note for further details.  The risks of cesarean section were discussed with the patient including but were not limited to: bleeding which may require transfusion or reoperation; infection which may require antibiotics; injury to bowel, bladder, ureters or other surrounding organs; injury to the fetus; need for additional procedures including hysterectomy in the event of a life-threatening hemorrhage; placental abnormalities wth subsequent pregnancies, incisional problems, thromboembolic phenomenon and other postoperative/anesthesia complications.   The patient concurred with the proposed plan, giving informed written consent for the procedure.    FINDINGS:  Viable female infant in cephalic presentation.  Apgars 9 and 9.  Clear amniotic fluid.  Intact placenta, three vessel cord.  Normal uterus, fallopian tubes and ovaries bilaterally.  ANESTHESIA: Epidural  ESTIMATED BLOOD LOSS: 900 ml URINE OUTPUT:  300 ml SPECIMENS: Placenta sent to pathology COMPLICATIONS: None immediate  PROCEDURE IN DETAIL:  The patient preoperatively received intravenous antibiotics and had sequential compression devices applied to her lower extremities.  She was then taken to the operating room the epidural anesthesia was dosed up to surgical level and was found to be adequate.  She was then placed in a dorsal supine position with a leftward tilt, and prepped and draped in a sterile manner.  After an adequate timeout was performed, a Pfannenstiel skin incision was made with scalpel and carried through to the underlying layer of fascia. The fascia was incised in the midline, and this incision was extended bilaterally using blunt dissection.  Kocher clamps were applied to the superior aspect of the fascial incision and the underlying rectus muscles were dissected off bluntly.  A similar process was carried out on the inferior aspect of the fascial incision. The rectus muscles were separated in the midline bluntly and the peritoneum was entered bluntly. Attention was turned to the lower uterine segment where a low transverse hysterotomy was made with a scalpel and extended bilaterally bluntly.  The infant was successfully delivered, the cord was clamped and cut after one minute, and the infant was handed over to the awaiting neonatology team. Uterine massage was then administered, and the placenta delivered intact with a three-vessel cord. The uterus was then cleared of clots and debris.  The hysterotomy was closed with 0 PDS in a running locked fashion, and an imbricating layer was also placed with 0 PDS.  The pelvis was cleared of all clot and debris. Hemostasis was confirmed on all surfaces.  The peritoneum was closed with a 0 Vicryl running stitch and the rectus muscles were reapproximated using 0 Vicryl interrupted stitch. The fascia was then closed using 0 Vicryl in a running fashion.  The subcutaneous layer was irrigated, then reapproximated with 2-0 plain gut interrupted stitches.The skin was closed with a 4-0 Vicryl subcuticular stitch. The patient tolerated the procedure well. Sponge, lap, instrument and needle counts were correct x 3.  She was taken to the recovery room in stable condition. Patient will continue amp and gent for 24 hours following  delivery for triple I.  Rolm BookbinderAmber  Kynan Peasley, DO Faculty Practice, [facility] [place]

## 2017-05-03 NOTE — Progress Notes (Signed)
[name] is a 27 y.o. G2P1001 at 1440w5d by ultrasound admitted for active labor  Subjective: Patient feeling well. No concerns. Per nursing staff patient had fever of 100.6 @ 1833. Repeat temp in 30 min of 100.4.   Objective: BP 133/84   Pulse 98   Temp (!) 100.4 F (38 C) (Oral)   Resp 20   Ht 4' 8.5" (1.435 m)   Wt 75.3 kg (166 lb)   BMI 36.56 kg/m  No intake/output data recorded. No intake/output data recorded.  FHT:  FHR: 150 bpm, variability: moderate,  accelerations:  Present,  decelerations:  Present early UC:   irregular, every 2-3 minutes SVE:   Dilation: 7 Effacement (%): 100 Station: 0 Exam by:: H. Koran, RNC  Labs: Lab Results  Component Value Date   WBC 15.5 (H) 05/03/2017   HGB 11.3 (L) 05/03/2017   HCT 33.3 (L) 05/03/2017   MCV 83.7 05/03/2017   PLT 272 05/03/2017    Assessment / Plan: Spontaneous labor, progressing normally  Labor: Progressing normally Fetal Wellbeing:  Category I Pain Control:  Epidural I/D:  1g tylenol, 2g amp q 6hrs, 5mg /kg gent daily Anticipated MOD:  NSVD  Amanda Sharp 05/03/2017, 7:15 PM

## 2017-05-03 NOTE — H&P (Signed)
LABOR AND DELIVERY ADMISSION HISTORY AND PHYSICAL NOTE  Amanda Sharp is a 27 y.o. female G2P1001 with IUP at 9969w5d by ultrasound presenting for SOL. Patient presented to MAU dilated to 3.5cm. After 2 hours patient was dilated to 5cm with bulging bag.  She reports positive fetal movement. She denies leakage of fluid or vaginal bleeding. Patient is having a boy but does not want circumcision. Patient is planning on breast and bottle feeding. Patient is undecided on contraception.   P[facility] History/Complications[facility]  Pregnancy complications:  - anemia of 1st trimester - abnormal 1 hr glucola, passed 3 hr glucola   Per chart review has history of pre-eclampsia with last baby in 2012 requiring induction at 38 weeks. History of pyelonephritis and right hydronephrosis in last pregnancy.    Past Medical History: Past Medical History:  Diagnosis Date  . Pregnancy induced hypertension    2013    Past Surgical History: Past Surgical History:  Procedure Laterality Date  . NO PAST SURGERIES      Obstetrical History: OB History    Gravida Para Term Preterm AB Living   2 1 1     1    SAB TAB Ectopic Multiple Live Births           1      Social History: Social History   Socioeconomic History  . Marital status: Married    Spouse name: None  . Number of children: None  . Years of education: None  . Highest education level: None  Social Needs  . Financial resource strain: None  . Food insecurity - worry: None  . Food insecurity - inability: None  . Transportation needs - medical: None  . Transportation needs - non-medical: None  Occupational History  . None  Tobacco Use  . Smoking status: Never Smoker  . Smokeless tobacco: Never Used  Substance and Sexual Activity  . Alcohol use: No  . Drug use: No  . Sexual activity: Yes    Birth control/protection: None  Other Topics Concern  . None  Social History Narrative   Marital status: married; from [place]; moved  to [place] with parents age 27.      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      Tobacco: none      Alcohol:  none    Family History: History reviewed. No pertinent family history.  Allergies: Allergies  Allergen Reactions  . Latex     Medications Prior to Admission  Medication Sig Dispense Refill Last Dose  . acyclovir (ZOVIRAX) 800 MG tablet Take 1 tablet (800 mg total) by mouth 5 (five) times daily. 35 tablet 0   . gabapentin (NEURONTIN) 300 MG capsule Take 1 capsule (300 mg total) by mouth 3 (three) times daily as needed. 40 capsule 3   . medroxyPROGESTERone (DEPO-PROVERA) 150 MG/ML injection Inject 150 mg into the muscle every 3 (three) months.   Taking  . predniSONE (DELTASONE) 20 MG tablet Three tablets daily x 1 day then two tablets daily x 5 days then one tablet daily x 5 days 18 tablet 0      Review of Systems  All systems reviewed and negative except as stated in HPI  Physical Exam Blood pressure 122/83, pulse 82, temperature 98.1 F (36.7 C), temperature source Oral, resp. rate 20, height 4' 8.5" (1.435 m), weight 75.3 kg (166 lb). General appearance: alert, oriented,  in distress during contractions  Lungs: normal respiratory effort Heart: regular rate Abdomen: soft, non-tender; gravid, FH appropriate for GA Extremities: No calf swelling or tenderness Presentation: cephalic Fetal monitoring: FHR 140, marked variability, early decels  Uterine activity: irregular q3-5 min  Dilation: 5 Effacement (%): 100 Station: -2 Exam by:: H. Koran, RNC  Prenatal labs: ABO, Rh:  A pos  Antibody:  Neg  Rubella:  Immune RPR:   Neg  HBsAg:   Neg HIV:   Non reactive GC/Chlamydia: Neg GBS:   Neg 1-hr GTT: 139, 3 Hr 114 Genetic screening:  Quad neg, cystic fibrosis neg  Anatomy US: normal   Prenatal Transfer Tool  Maternal Diabetes: No Genetic Screening: Normal Maternal Ultrasounds/Referrals: Normal Fetal  Ultrasounds or other Referrals:  None Maternal Substance Abuse:  No Significant Maternal Medications:  None Significant Maternal Lab Results: Lab values include: Group B Strep negative  Results for orders placed or performed during the hospital encounter of 05/03/17 (from the past 24 hour(s))  CBC   Collection Time: 05/03/17  9:15 AM  Result Value Ref Range   WBC 15.5 (H) 4.0 - 10.5 K/uL   RBC 3.98 3.87 - 5.11 MIL/uL   Hemoglobin 11.3 (L) 12.0 - 15.0 g/dL   HCT 04.5 (L) 40.9 - 81.1 %   MCV 83.7 78.0 - 100.0 fL   MCH 28.4 26.0 - 34.0 pg   MCHC 33.9 30.0 - 36.0 g/dL   RDW 91.4 78.2 - 95.6 %   Platelets 272 150 - 400 K/uL    Patient Active Problem List   Diagnosis Date Noted  . Pregnancy 05/03/2017    Assessment: Amanda Sharp is a 27 y.o. G2P1001 at [redacted]w[redacted]d here for SOL.   #Labor: amniotic sac ruptured by Dr. Rachelle Hora, plan for SVD #Pain: Undecided on epidural. Has oxycodone and fentanyl ordered  #FWB: Category I  #ID:  GBS neg #MOF: breast and bottle  #MOC:undecided, given information on forms of contraception  #Circ:  None   Amanda Sharp 05/03/2017, 10:15 AM

## 2017-05-03 NOTE — Progress Notes (Signed)
Amanda PiggJohana Sharp is a 27 y.o. G2P1001 at 2765w5d by LMP admitted for active labor she progressed to 7 cm then has had secondary arrest of labor, now remains at 7-8 cm, with EFW of this baby over 8 lb by leopold by me. Contraction intensity by iupc adequate.MVU's/ Essentially no progress in 8 hrs.   Subjective:pt anxious VH:QIONGEXBre:cesarean due to a family member having to have a c/s as emergency and scary to family. Process of a c/s explained in detail , with family support assisting in discussion. Pt now agrees to cesarean at 9:45  Objective: BP 109/61   Pulse (!) 116   Temp 98.9 F (37.2 C) (Oral)   Resp 14   Ht 4' 8.5" (1.435 m)   Wt 166 lb (75.3 kg)   BMI 36.56 kg/m  No intake/output data recorded. Total I/O In: -  Out: 550 [Urine:550]  FHT:  FHR: 145 bpm, variability: moderate,  accelerations:  Present,  decelerations:  Absent UC:   regular, every 3 minutes SVE:   Dilation: 8 Effacement (%): 80 Station: -2 Exam by:: Dr. Vinie SillMoss(and Dr. Tyrell AntonioFurgeson)  Labs: Lab Results  Component Value Date   WBC 15.5 (H) 05/03/2017   HGB 11.3 (L) 05/03/2017   HCT 33.3 (L) 05/03/2017   MCV 83.7 05/03/2017   PLT 272 05/03/2017    Assessment / Plan: Arrest in active phase of labor  Labor: cesarean recommended Preeclampsia:   Fetal Wellbeing:  Category I Pain Control:  Epidural I/D:  n/a Anticipated MOD:  cesarean to be done, risks, benefits discussed.  Tilda BurrowJohn V Shantil Vallejo 05/03/2017, 9:26 PM

## 2017-05-03 NOTE — Anesthesia Preprocedure Evaluation (Signed)
Anesthesia Evaluation  Patient identified by MRN, date of birth, ID band Patient awake    Reviewed: Allergy & Precautions, NPO status , Patient's Chart, lab work & pertinent test results  Airway Mallampati: II  TM Distance: >3 FB Neck ROM: Full    Dental  (+) Dental Advisory Given   Pulmonary neg pulmonary ROS,    Pulmonary exam normal breath sounds clear to auscultation       Cardiovascular Normal cardiovascular exam Rhythm:Regular Rate:Normal     Neuro/Psych negative neurological ROS  negative psych ROS   GI/Hepatic negative GI ROS, Neg liver ROS,   Endo/Other  Obesity  Renal/GU negative Renal ROS  negative genitourinary   Musculoskeletal negative musculoskeletal ROS (+)   Abdominal   Peds  Hematology  (+) anemia ,   Anesthesia Other Findings   Reproductive/Obstetrics (+) Pregnancy Hx PIH with previous pregnancy                             Anesthesia Physical Anesthesia Plan  ASA: II  Anesthesia Plan: Epidural   Post-op Pain Management:    Induction:   PONV Risk Score and Plan:   Airway Management Planned: Natural Airway  Additional Equipment:   Intra-op Plan:   Post-operative Plan:   Informed Consent: I have reviewed the patients History and Physical, chart, labs and discussed the procedure including the risks, benefits and alternatives for the proposed anesthesia with the patient or authorized representative who has indicated his/her understanding and acceptance.     Plan Discussed with:   Anesthesia Plan Comments: (Labs reviewed. Platelets acceptable, patient not taking any blood thinning medications. Risks and benefits discussed with patient, patient expressed understanding and wished to proceed.)        Anesthesia Quick Evaluation

## 2017-05-04 ENCOUNTER — Encounter (HOSPITAL_COMMUNITY): Payer: Self-pay

## 2017-05-04 LAB — CREATININE, SERUM: Creatinine, Ser: 0.93 mg/dL (ref 0.44–1.00)

## 2017-05-04 LAB — CBC
HCT: 26.5 % — ABNORMAL LOW (ref 36.0–46.0)
HEMOGLOBIN: 9.1 g/dL — AB (ref 12.0–15.0)
MCH: 29.1 pg (ref 26.0–34.0)
MCHC: 34.3 g/dL (ref 30.0–36.0)
MCV: 84.7 fL (ref 78.0–100.0)
Platelets: 209 10*3/uL (ref 150–400)
RBC: 3.13 MIL/uL — ABNORMAL LOW (ref 3.87–5.11)
RDW: 14.1 % (ref 11.5–15.5)
WBC: 28.8 10*3/uL — AB (ref 4.0–10.5)

## 2017-05-04 LAB — RPR: RPR: NONREACTIVE

## 2017-05-04 MED ORDER — DEXTROSE 5 % IV SOLN
5.0000 mg/kg | INTRAVENOUS | Status: DC
Start: 2017-05-04 — End: 2017-05-04
  Filled 2017-05-04: qty 6.75

## 2017-05-04 MED ORDER — MENTHOL 3 MG MT LOZG: 1.0000 | LOZENGE | OROMUCOSAL | Status: DC | PRN

## 2017-05-04 MED ORDER — PRENATAL MULTIVITAMIN CH
1.0000 | ORAL_TABLET | Freq: Every day | ORAL | Status: DC
  Administered 2017-05-04 – 2017-05-05 (×2): 1 via ORAL
  Filled 2017-05-04 (×2): qty 1

## 2017-05-04 MED ORDER — SODIUM CHLORIDE 0.9 % IV SOLN
2.0000 g | Freq: Four times a day (QID) | INTRAVENOUS | Status: DC
  Administered 2017-05-04 (×3): 2 g via INTRAVENOUS
  Filled 2017-05-04 (×4): qty 2000

## 2017-05-04 MED ORDER — IBUPROFEN 600 MG PO TABS
600.0000 mg | ORAL_TABLET | Freq: Four times a day (QID) | ORAL | Status: DC
  Administered 2017-05-04 – 2017-05-05 (×7): 600 mg via ORAL
  Filled 2017-05-04 (×7): qty 1

## 2017-05-04 MED ORDER — FENTANYL CITRATE (PF) 100 MCG/2ML IJ SOLN
INTRAMUSCULAR | Status: AC
  Filled 2017-05-04: qty 2

## 2017-05-04 MED ORDER — ACETAMINOPHEN 325 MG PO TABS: 650.0000 mg | ORAL_TABLET | ORAL | Status: DC | PRN

## 2017-05-04 MED ORDER — TETANUS-DIPHTH-ACELL PERTUSSIS 5-2.5-18.5 LF-MCG/0.5 IM SUSP: 0.5000 mL | Freq: Once | INTRAMUSCULAR | Status: DC

## 2017-05-04 MED ORDER — SIMETHICONE 80 MG PO CHEW
80.0000 mg | CHEWABLE_TABLET | Freq: Three times a day (TID) | ORAL | Status: DC
  Administered 2017-05-04 – 2017-05-05 (×6): 80 mg via ORAL
  Filled 2017-05-04 (×6): qty 1

## 2017-05-04 MED ORDER — SENNOSIDES-DOCUSATE SODIUM 8.6-50 MG PO TABS
2.0000 | ORAL_TABLET | ORAL | Status: DC
  Administered 2017-05-04: 2 via ORAL
  Filled 2017-05-04: qty 2

## 2017-05-04 MED ORDER — OXYTOCIN 40 UNITS IN LACTATED RINGERS INFUSION - SIMPLE MED: 2.5000 [IU]/h | INTRAVENOUS | Status: AC

## 2017-05-04 MED ORDER — COCONUT OIL OIL: 1.0000 "application " | TOPICAL_OIL | Status: DC | PRN

## 2017-05-04 MED ORDER — DIBUCAINE 1 % RE OINT: 1.0000 "application " | TOPICAL_OINTMENT | RECTAL | Status: DC | PRN

## 2017-05-04 MED ORDER — ENOXAPARIN SODIUM 40 MG/0.4ML ~~LOC~~ SOLN
40.0000 mg | SUBCUTANEOUS | Status: DC
  Administered 2017-05-04: 40 mg via SUBCUTANEOUS
  Filled 2017-05-04: qty 0.4

## 2017-05-04 MED ORDER — LACTATED RINGERS IV SOLN
INTRAVENOUS | Status: DC
  Administered 2017-05-04: 08:00:00 via INTRAVENOUS

## 2017-05-04 MED ORDER — ZOLPIDEM TARTRATE 5 MG PO TABS: 5.0000 mg | ORAL_TABLET | Freq: Every evening | ORAL | Status: DC | PRN

## 2017-05-04 MED ORDER — SIMETHICONE 80 MG PO CHEW: 80.0000 mg | CHEWABLE_TABLET | ORAL | Status: DC | PRN

## 2017-05-04 MED ORDER — WITCH HAZEL-GLYCERIN EX PADS: 1.0000 "application " | MEDICATED_PAD | CUTANEOUS | Status: DC | PRN

## 2017-05-04 MED ORDER — SIMETHICONE 80 MG PO CHEW
80.0000 mg | CHEWABLE_TABLET | ORAL | Status: DC
  Administered 2017-05-04: 80 mg via ORAL
  Filled 2017-05-04: qty 1

## 2017-05-04 MED ORDER — ENOXAPARIN SODIUM 40 MG/0.4ML ~~LOC~~ SOLN
40.0000 mg | SUBCUTANEOUS | Status: DC
  Administered 2017-05-05: 40 mg via SUBCUTANEOUS
  Filled 2017-05-04 (×3): qty 0.4

## 2017-05-04 MED ORDER — DIPHENHYDRAMINE HCL 25 MG PO CAPS: 25.0000 mg | ORAL_CAPSULE | Freq: Four times a day (QID) | ORAL | Status: DC | PRN

## 2017-05-04 MED ORDER — OXYCODONE-ACETAMINOPHEN 5-325 MG PO TABS: 2.0000 | ORAL_TABLET | Freq: Four times a day (QID) | ORAL | Status: DC | PRN

## 2017-05-04 NOTE — Anesthesia Postprocedure Evaluation (Signed)
Anesthesia Post Note  Patient: Amanda Sharp  Procedure(s) Performed: CESAREAN SECTION (N/A )     Anesthesia Post Evaluation  Last Vitals:  Vitals:   05/04/17 0030 05/04/17 0100  BP: (!) 91/57 (!) 98/54  Pulse: 87 72  Resp: 19 18  Temp: 37.3 C 37.7 C  SpO2: 95% 95%    Last Pain:  Vitals:   05/04/17 0100  TempSrc: Axillary  PainSc:    Pain Goal: Patients Stated Pain Goal: 3 (05/03/17 0951)               Aasir Daigler

## 2017-05-04 NOTE — Anesthesia Postprocedure Evaluation (Signed)
Anesthesia Post Note  Patient: Libby Lissette Mejia Melara  Procedure(s) Performed: CESAREAN SECTION (N/A )     Patient location during evaluation: Mother Baby Anesthesia Type: Epidural Level of consciousness: awake and alert and oriented Pain management: satisfactory to patient Vital Signs Assessment: post-procedure vital signs reviewed and stable Respiratory status: respiratory function stable Cardiovascular status: stable Postop Assessment: no headache, no backache, epidural receding, patient able to bend at knees, no signs of nausea or vomiting and adequate PO intake Anesthetic complications: no    Last Vitals:  Vitals:   05/04/17 0700 05/04/17 0800  BP:  (!) 91/45  Pulse:  (!) 59  Resp:  16  Temp:  36.8 C  SpO2: 99% 96%    Last Pain:  Vitals:   05/04/17 0800  TempSrc: Oral  PainSc: 0-No pain   Pain Goal: Patients Stated Pain Goal: 3 (05/03/17 0951)               Karleen DolphinFUSSELL,Janiah Devinney

## 2017-05-04 NOTE — Addendum Note (Signed)
Addendum  created 05/04/17 0545 by Jennye Boroughsollins, Amedio Bowlby J, CRNA   Intraprocedure Staff edited

## 2017-05-04 NOTE — Progress Notes (Addendum)
POSTPARTUM PROGRESS NOTE  Post Partum Day 1  Subjective:  Amanda Sharp is a 27 y.o. [id number] s/p LTCS at 1576w5d at 2200.  No acute events overnight.  Pt reports problems with ambulating, and po intake 2/2 nausea, denies problems voiding.  She reports nausea or vomiting.  Pain is well controlled.  She has not had flatus. She has not had bowel movement.  Lochia Small.   Objective: Blood pressure (!) 107/52, pulse 65, temperature 98 F (36.7 C), temperature source Oral, resp. rate 18, height 4' 8.5" (1.435 m), weight 72.3 kg (159 lb 8 oz), SpO2 99 %, unknown if currently breastfeeding.  Physical Exam:  General: alert, cooperative and no distress Chest: no respiratory distress Heart:regular rate, distal pulses intact Abdomen: soft, nontender,  Uterine Fundus: firm, appropriately tender DVT Evaluation: No calf swelling or tenderness Extremities: trace edema Skin: warm, dry; incision clean/dry/intact without significant purulence, erythema, or tenderness  Recent Labs    05/03/17 0915 05/04/17 0557  HGB 11.3* 9.1*  HCT 33.3* 26.5*    Assessment/Plan: Amanda Sharp is a 27 y.o. G2P2002 s/p LTCS at 976w5d.  PPD#1 - Doing well but has progress to make with nausea & PO intake.  Contraception: undecided Feeding: breast and bottle Dispo: Plan for discharge likely tomorrow.   LOS: 1 day   Alroy BailiffParker W Leland, MD 05/04/2017, 7:49 AM   I confirm that I have verified the information documented in the resident's note and that I have also personally reperformed the physical exam and all medical decision making activities.  Cam HaiSHAW, Lashara Urey CNM 05/17/2017 8:44 PM

## 2017-05-04 NOTE — Lactation Note (Signed)
This note was copied from a baby's chart. Lactation Consultation Note  Patient Name: Amanda Sharp [id number]'BToday's Date: 05/04/2017 Reason for consult: Initial assessment  13 hours old 4839w 5d female who is being partially BF and formula fed by his mother, she chose to do both at admission. Mom stated feedings at the breast were comfortable Discussed with mom the importance of providing breastmilk over formula, lactogenesis and engorgement. Mom is already familiar with hand expression and pumping, reviewed hand pump assembly and cleaning. She is aware of BF support group, OP services and will reach out if needed.   Maternal Data Formula Feeding for Exclusion: No Has patient been taught Hand Expression?: Yes Does the patient have breastfeeding experience prior to this delivery?: Yes  Feeding Feeding Type: Breast Milk with Formula added Length of feed: 17 min   Interventions Interventions: Breast feeding basics reviewed;Skin to skin;Breast massage;Hand express;Breast compression;Hand pump  Lactation Tools Discussed/Used Tools: Pump Breast pump type: Manual Pump Review: Setup, frequency, and cleaning Initiated by:: MPeck Date initiated:: 05/04/17   Consult Status Consult Status: Follow-up Date: 05/05/17 Follow-up type: In-patient    Amanda Sharp Amanda Sharp 05/04/2017, 12:02 PM

## 2017-05-04 NOTE — Addendum Note (Signed)
Addendum  created 05/04/17 0924 by Graciela HusbandsFussell, Carole Doner O, CRNA   Sign clinical note

## 2017-05-05 ENCOUNTER — Encounter (HOSPITAL_COMMUNITY): Payer: Self-pay | Admitting: Obstetrics and Gynecology

## 2017-05-05 MED ORDER — IBUPROFEN 600 MG PO TABS: 600.0000 mg | ORAL_TABLET | Freq: Four times a day (QID) | ORAL | 0 refills | Status: DC

## 2017-05-05 NOTE — Discharge Summary (Signed)
OB Discharge Summary     Patient Name: Amanda Sharp DOB: Sep 22, 1990 MRN: [id number]  Date of admission: 05/03/2017 Delivering MD: Rolm Bookbinder   Date of discharge: 05/05/2017  Admitting diagnosis: 39 WEEKS CTX Intrauterine pregnancy: [redacted]w[redacted]d     Secondary diagnosis:  Active Problems:   Pregnancy      Discharge diagnosis: Term Pregnancy Delivered                                                                                                Post partum procedures:n/a  Augmentation: AROM and Pitocin  Complications: None  Hospital course:  Onset of Labor With Unplanned C/S  27 y.o. yo [id number] at [redacted]w[redacted]d was admitted in Active Labor on 05/03/2017. Patient had a labor course significant for treatment of fevers for suspicion of intramniotic infection with ampicillin and gentamycin for 24 hour course after delivery, fevers resolved without incident. Membrane Rupture Time/Date: 10:00 AM ,05/03/2017   The patient went for cesarean section due to Arrest of Dilation, and delivered a Viable infant,05/03/2017  Details of operation can be found in separate operative note. Patient had an uncomplicated postpartum course.  She is ambulating,tolerating a regular diet, passing flatus, and urinating well.  Patient is discharged home in stable condition 05/05/17.  Physical exam  Vitals:   05/04/17 1827 05/04/17 1930 05/05/17 0538 05/05/17 0541  BP:  (!) 88/47 (!) 81/38   Pulse:  (!) 59 65   Resp:  18 18   Temp:  98.8 F (37.1 C) 98.5 F (36.9 C)   TempSrc:  Oral Oral   SpO2: 97% 96% 99%   Weight:    73 kg (161 lb)  Height:       General: alert, cooperative and no distress Lochia: appropriate Uterine Fundus: firm Incision: Healing well with no significant drainage DVT Evaluation: No evidence of DVT seen on physical exam. Negative Homan's sign. Calf/Ankle edema is present Labs: Lab Results  Component Value Date   WBC 28.8 (H) 05/04/2017   HGB 9.1 (L) 05/04/2017   HCT 26.5 (L)  05/04/2017   MCV 84.7 05/04/2017   PLT 209 05/04/2017   CMP Latest Ref Rng & Units 05/04/2017  Glucose 70 - 99 mg/dL -  BUN 6 - 23 mg/dL -  Creatinine 1.30 - 8.65 mg/dL 7.84  Sodium 696 - 295 mEq/L -  Potassium 3.5 - 5.1 mEq/L -  Chloride 96 - 112 mEq/L -  CO2 19 - 32 mEq/L -  Calcium 8.4 - 10.5 mg/dL -  Total Protein 6.0 - 8.3 g/dL -  Total Bilirubin 0.3 - 1.2 mg/dL -  Alkaline Phos 39 - 284 U/L -  AST 0 - 37 U/L -  ALT 0 - 35 U/L -    Discharge instruction: per After Visit Summary and "Baby and Me Booklet".  After visit meds:  Allergies as of 05/05/2017      Reactions   Latex Itching      Medication List    STOP taking these medications   prenatal multivitamin Tabs tablet   prenatal vitamin w/FE, FA 27-1 MG Tabs tablet  TAKE these medications   acetaminophen 325 MG tablet Commonly known as:  TYLENOL Take 650 mg by mouth daily as needed for fever or headache (pain).   ibuprofen 600 MG tablet Commonly known as:  ADVIL,MOTRIN Take 1 tablet (600 mg total) by mouth every 6 (six) hours.       Diet: routine diet  Activity: Advance as tolerated. Pelvic rest for 6 weeks.   Outpatient follow up:4 weeks Follow up Appt:No future appointments. Follow up Visit:No Follow-up on file.  Postpartum contraception: Undecided  Newborn Data: Live born female  Birth Weight: 8 lb 3.8 oz (3735 g) APGAR: 9, 9  Newborn Delivery   Birth date/time:  05/03/2017 22:23:00 Delivery type:  C-Section, Low Transverse C-section categorization:  Primary     Baby Feeding: Bottle and Breast Disposition:home with mother   05/05/2017 Alroy BailiffParker W Jariana Shumard, MD

## 2017-05-05 NOTE — Lactation Note (Signed)
This note was copied from a baby's chart. Lactation Consultation Note  Patient Name: Amanda Sharp: 05/05/2017    39 hours old 7139w 5d female who is still being partially BF and formula fed by his mother. Mom stated the she has not put baby on the breast since 8 am this morning, all the subsequent feeding were formula. When asked mom why, she said that baby is not latching on. Offered assistance to latch baby at the breast but mom said she'll try later on her own. Breasts are intact, but sensitive per mom. Encourage to continue providing breastmilk for baby (even if it's on a bottle) over formula; explained the role of formula and how it interferes with feeding cues. Mom is aware of LC services and will call us if needed.  [name] S [name] 05/05/2017, 2:11 PM

## 2017-05-06 ENCOUNTER — Ambulatory Visit: Payer: Self-pay

## 2017-05-06 NOTE — Lactation Note (Signed)
This note was copied from a baby's chart. Lactation Consultation Note  Patient Name: Amanda Rayetta PiggJohana Melara EAVWU'JToday's Date: 05/06/2017 Reason for consult: Follow-up assessment;Term   Follow up with mom of 3762 hour old infant. Infant with 5 BF for 10-45 minutes, formula x 4 of 42-56 cc, 2 voids and 3 stools in the last 24 hours. LATCH score 9. Infant weight 8 lb 0.9 oz with 2% weight loss since birth.   Mom reports BF is going slow. She reports she is not feeling fuller and is not having difficulty with BF. Mom declined BF assistance before d/c home.   Enc mom to BF with each feeding and discussed supply and demand and importance of stimulating breast to stimulate milk supply. Mom has a manual pump for home use. Enc her to pump when not putting infant to the breast to feed infant her milk as available. Mom voiced understanding. Mom has a manual pump for home use.   Reviewed I/O, signs of dehydration in the infant, engorgement prevention/treatment and breast milk expression and storage. [facility], mom informed of IP/OP Services, BF Support Groups and LC phone #. Enc mom to call Lactation or WIC for BF assistance as needed. [facility] reports [facility] Mom reports she has no questions/concerns at this time.    Maternal Data Formula Feeding for Exclusion: Yes Reason for exclusion: Mother's choice to formula and breast feed on admission Has patient been taught Hand Expression?: Yes Does the patient have breastfeeding experience prior to this delivery?: Yes  Feeding    LATCH Score                   Interventions    Lactation Tools Discussed/Used WIC Program: Yes   Consult Status Consult Status: Complete Follow-up type: Call as needed    Ed BlalockSharon S Dniya Neuhaus 05/06/2017, 12:31 PM

## 2018-01-02 ENCOUNTER — Emergency Department (HOSPITAL_COMMUNITY)
Admission: EM | Admit: 2018-01-02 | Discharge: 2018-01-02 | Disposition: A | Discharge status: Home / Self Care | Payer: Self-pay | Attending: Emergency Medicine | Admitting: Emergency Medicine

## 2018-01-02 ENCOUNTER — Encounter (HOSPITAL_COMMUNITY): Payer: Self-pay | Admitting: Emergency Medicine

## 2018-01-02 ENCOUNTER — Other Ambulatory Visit: Payer: Self-pay

## 2018-01-02 DIAGNOSIS — H1013 Acute atopic conjunctivitis, bilateral: Secondary | ICD-10-CM | POA: Insufficient documentation

## 2018-01-02 MED ORDER — CETIRIZINE HCL 10 MG PO TABS: 10.0000 mg | ORAL_TABLET | Freq: Every day | ORAL | 0 refills | Status: AC

## 2018-01-02 MED ORDER — TETRACAINE HCL 0.5 % OP SOLN
2.0000 [drp] | Freq: Once | OPHTHALMIC | Status: AC
  Administered 2018-01-02: 2 [drp] via OPHTHALMIC
  Filled 2018-01-02: qty 4

## 2018-01-02 MED ORDER — NAPHAZOLINE-PHENIRAMINE 0.025-0.3 % OP SOLN: 1.0000 [drp] | Freq: Four times a day (QID) | OPHTHALMIC | 0 refills | Status: AC | PRN

## 2018-01-02 MED ORDER — FLUORESCEIN SODIUM 1 MG OP STRP
1.0000 | ORAL_STRIP | Freq: Once | OPHTHALMIC | Status: AC
  Administered 2018-01-02: 1 via OPHTHALMIC
  Filled 2018-01-02: qty 1

## 2018-01-02 NOTE — ED Triage Notes (Signed)
Pt reports bilateral eye irritation that started Saturday night. Pt reports waking up Sunday with her eyes closed shut and drainage. Pt reports pain to palpation around eyes. Pt denies any vision loss or injuries.

## 2018-01-02 NOTE — ED Provider Notes (Signed)
[facility] EMERGENCY DEPARTMENT Provider Note   CSN: [id number] Arrival date & time: 01/02/18  1553     History   Chief Complaint Chief Complaint  Patient presents with  . Conjunctivitis    HPI Amanda Sharp is a 27 y.o. female who presents emergency department today with bilateral eye redness.  Patient reports that over the weekend she started developing bilateral eye redness, itching, irritation with associated nasal congestion and sneezing.  She notes some clear watery discharge but denies any crusting.  She denies any sick contacts.  No interventions prior to arrival.  Has nothing makes her symptoms better or worse.  She notes that her symptoms have not worsened or improved.  She denies any fever, headache, jaw claudication, visual changes, blurring, diplopia, photophobia, foreign body sensation, trauma, rash, eye pain, painful extract movements.  HPI  Past Medical History:  Diagnosis Date  . Pregnancy induced hypertension    2013  . Pyelonephritis, acute    2012 this pregnancy    Patient Active Problem List   Diagnosis Date Noted  . Pregnancy 05/03/2017  . Postpartum care following vaginal delivery 12/08/2010    Past Surgical History:  Procedure Laterality Date  . CESAREAN SECTION N/A 05/03/2017   Procedure: CESAREAN SECTION;  Surgeon: Tilda Burrow, MD;  Location: [facility] BIRTHING SUITES;  Service: Obstetrics;  Laterality: N/A;  . NO PAST SURGERIES       OB History    Gravida  4   Para  3   Term  3   Preterm  0   AB  0   Living  3     SAB  0   TAB  0   Ectopic  0   Multiple      Live Births  3            Home Medications    Prior to Admission medications   Medication Sig Start Date End Date Taking? Authorizing Provider  acetaminophen (TYLENOL) 325 MG tablet Take 650 mg by mouth daily as needed for fever or headache (pain).    [provider]  ibuprofen (ADVIL,MOTRIN) 600 MG tablet Take 1 tablet  (600 mg total) by mouth every 6 (six) hours. 05/05/17   Alroy Bailiff, MD    Family History Family History  Problem Relation Age of Onset  . Anesthesia problems Neg Hx   . Hypotension Neg Hx   . Malignant hyperthermia Neg Hx   . Pseudochol deficiency Neg Hx     Social History Social History   Tobacco Use  . Smoking status: Never Smoker  . Smokeless tobacco: Never Used  Substance Use Topics  . Alcohol use: No  . Drug use: No     Allergies   Latex   Review of Systems Review of Systems  All other systems reviewed and are negative.    Physical Exam Updated Vital Signs BP 121/78 (BP Location: Right Arm)   Pulse 87   Temp 98.9 F (37.2 C) (Oral)   Resp 16   Ht 4\' 5"  (1.346 m)   Wt 68.9 kg   LMP 12/16/2017   SpO2 100%   Breastfeeding? Unknown   BMI 38.04 kg/m   Physical Exam  Constitutional: She appears well-developed and well-nourished. No distress.  HENT:  Head: Normocephalic and atraumatic.  Right Ear: Hearing, tympanic membrane, external ear and ear canal normal. No foreign bodies. Tympanic membrane is not injected, not perforated, not erythematous, not retracted and not bulging.  Left Ear: Hearing, tympanic membrane, external ear and ear canal normal. No foreign bodies. Tympanic membrane is not injected, not perforated, not erythematous, not retracted and not bulging.  Nose: Mucosal edema and rhinorrhea present. No sinus tenderness, septal deviation or nasal septal hematoma.  No foreign bodies. Right sinus exhibits no maxillary sinus tenderness and no frontal sinus tenderness. Left sinus exhibits no maxillary sinus tenderness and no frontal sinus tenderness.  Mouth/Throat: Uvula is midline, oropharynx is clear and moist and mucous membranes are normal. No tonsillar exudate.  The patient has normal phonation and is in control of secretions. No stridor.  Midline uvula without edema. Soft palate rises symmetrically.  No tonsillar erythema or exudates. No PTA.  Tongue protrusion is normal. No trismus. No creptius on neck palpation and patient has good dentition. No gingival erythema or fluctuance noted. Mucus membranes moist.  Eyes: Pupils are equal, round, and reactive to light. EOM and lids are normal. Right eye exhibits no discharge. Left eye exhibits no discharge. Right conjunctiva is injected. Left conjunctiva is injected. No scleral icterus.  Bilateral conjunctiva injection.  No scleral changes.  No discharge noted.  PEERL intact. EOMI without nystagmus or pain. No photophobia or consensual photophobia.  Corneal Abrasion Exam VCO. Risks, benefits and alternatives explained. 1 drops of tetracaine (PONTOCAINE) 0.5 % ophthalmic solution were applied to the both eye. Fluorescein 1 MG ophthalmic strip applied the the surface of the both eye Wood's lamp used to screen for abrasion. No increased fluorescein uptake. No corneal ulcer. Negative Seidel sign. No foreign bodies noted. No visible hyphema.  Eye flushed with sterile saline Patient tolerated the procedure well  Neck: Trachea normal, normal range of motion, full passive range of motion without pain and phonation normal. Neck supple. No spinous process tenderness and no muscular tenderness present. No neck rigidity. No tracheal deviation and normal range of motion present.  No nuchal rigidity or meningismus  Cardiovascular: Normal rate, regular rhythm and intact distal pulses.  No murmur heard. Pulses:      Radial pulses are 2+ on the right side, and 2+ on the left side.       Dorsalis pedis pulses are 2+ on the right side, and 2+ on the left side.       Posterior tibial pulses are 2+ on the right side, and 2+ on the left side.  Pulmonary/Chest: Effort normal and breath sounds normal. No stridor. She has no wheezes. She exhibits no tenderness.  Abdominal: Soft. Bowel sounds are normal.  Musculoskeletal: She exhibits no edema.  Lymphadenopathy:       Head (right side): No submental, no  submandibular, no tonsillar, no preauricular, no posterior auricular and no occipital adenopathy present.       Head (left side): No submental, no submandibular, no tonsillar, no preauricular, no posterior auricular and no occipital adenopathy present.    She has no cervical adenopathy.  Neurological: She is alert.  Skin: Skin is warm and dry. No rash noted. She is not diaphoretic.  Psychiatric: She has a normal mood and affect.  Nursing note and vitals reviewed.    ED Treatments / Results  Labs (all labs ordered are listed, but only abnormal results are displayed) Labs Reviewed - No data to display  EKG None  Radiology No results found.  Procedures Procedures (including critical care time)  Medications Ordered in ED Medications  fluorescein ophthalmic strip 1 strip (1 strip Both Eyes Given 01/02/18 1848)  tetracaine (PONTOCAINE) 0.5 % ophthalmic solution 2 drop (  2 drops Both Eyes Given 01/02/18 1848)     Initial Impression / Assessment and Plan / ED Course  I have reviewed the triage vital signs and the nursing notes.  Pertinent labs & imaging results that were available during my care of the patient were reviewed by me and considered in my medical decision making (see chart for details).     Patient presentation consistent with allergic conjunctivitis.  Patient reports bilateral eye redness, itching, nasal congestion and sneezing.  No purulent discharge, corneal abrasions, entrapment, consensual photophobia, or dendritic staining with fluorescein study.  Presentation non-concerning for iritis, bacterial conjunctivitis, corneal abrasions, or HSV.  No antibiotics are indicated and patient will be prescribed naphazoline for itching. Will also recommend zyrtec.  Patient advised to followup with ophthalmologist if symptoms persist or worsen in any way including vision change or purulent discharge. Return precautions discussed.  Patient verbalizes understanding and is agreeable with  discharge.   Final Clinical Impressions(s) / ED Diagnoses   Final diagnoses:  Allergic conjunctivitis of both eyes    ED Discharge Orders         Ordered    naphazoline-pheniramine (NAPHCON-A) 0.025-0.3 % ophthalmic solution  4 times daily PRN     01/02/18 1917    cetirizine (ZYRTEC) 10 MG tablet  Daily     01/02/18 1917           Princella Pellegrini 01/02/18 1918    Sabas Sous, MD 01/03/18 [phone]

## 2023-10-25 ENCOUNTER — Ambulatory Visit
Admission: EM | Admit: 2023-10-25 | Discharge: 2023-10-25 | Disposition: A | Discharge status: Home / Self Care | Attending: Family Medicine | Admitting: Family Medicine

## 2023-10-25 DIAGNOSIS — S39012A Strain of muscle, fascia and tendon of lower back, initial encounter: Secondary | ICD-10-CM | POA: Insufficient documentation

## 2023-10-25 DIAGNOSIS — N3001 Acute cystitis with hematuria: Secondary | ICD-10-CM | POA: Insufficient documentation

## 2023-10-25 LAB — POCT URINALYSIS DIP (MANUAL ENTRY)
Bilirubin, UA: NEGATIVE
Glucose, UA: 100 mg/dL — AB
Nitrite, UA: POSITIVE — AB
Protein Ur, POC: >=300 mg/dL — AB
Spec Grav, UA: <=1.005 — AB (ref 1.010–1.025)
Urobilinogen, UA: 2 U/dL — AB
pH, UA: 5 (ref 5.0–8.0)

## 2023-10-25 MED ORDER — CEFDINIR 300 MG PO CAPS: 300.0000 mg | ORAL_CAPSULE | Freq: Two times a day (BID) | ORAL | 0 refills | Status: AC

## 2023-10-25 MED ORDER — IBUPROFEN 800 MG PO TABS: 800.0000 mg | ORAL_TABLET | Freq: Three times a day (TID) | ORAL | 0 refills | Status: AC

## 2023-10-25 NOTE — Discharge Instructions (Addendum)
 Advised patient to take medication as directed with food to completion.  Advised patient may take 800 mg Advil  daily or as needed for accompanying lower back strain.  Encouraged increase daily water intake to 64 ounces per day while taking this medication.  Advised we will follow-up with urine culture results once received.  Advised if symptoms worsen and/or unresolved please follow-up with your PCP or here for further evaluation.

## 2023-10-25 NOTE — ED Triage Notes (Signed)
 Pt presents to uc with co back pain bilateral lumbar region radiating around to the front since last night. Pt endorses dysuria since sunday  Pt has taken  tylenol  and AZO but pain is still  9/10

## 2023-10-25 NOTE — ED Provider Notes (Signed)
 [facility]    CSN: [id number] Arrival date & time: 10/25/23  1039      History   Chief Complaint Chief Complaint  Patient presents with   Flank Pain   Dysuria    HPI Amanda Sharp is a 33 y.o. female.   HPI 33 year old female presents with bilateral lumbar pain radiating to front since last night.  Patient endorses dysuria for 5 days.  PMH significant for pregnancy-induced hypertension and pyelonephritis.  Past Medical History:  Diagnosis Date   Pregnancy induced hypertension    2013   Pyelonephritis, acute    2012 this pregnancy    Patient Active Problem List   Diagnosis Date Noted   Pregnancy 05/03/2017   Postpartum care following vaginal delivery 12/08/2010    Past Surgical History:  Procedure Laterality Date   CESAREAN SECTION N/A 05/03/2017   Procedure: CESAREAN SECTION;  Surgeon: Edsel Norleen GAILS, MD;  Location: [facility] BIRTHING SUITES;  Service: Obstetrics;  Laterality: N/A;   NO PAST SURGERIES      OB History     Gravida  4   Para  3   Term  3   Preterm  0   AB  0   Living  3      SAB  0   IAB  0   Ectopic  0   Multiple      Live Births  3            Home Medications    Prior to Admission medications   Medication Sig Start Date End Date Taking? Authorizing Provider  cefdinir (OMNICEF) 300 MG capsule Take 1 capsule (300 mg total) by mouth 2 (two) times daily for 7 days. 10/25/23 11/01/23 Yes Teddy Sharper, FNP  ibuprofen  (ADVIL ) 800 MG tablet Take 1 tablet (800 mg total) by mouth 3 (three) times daily. 10/25/23  Yes Teddy Sharper, FNP  acetaminophen  (TYLENOL ) 325 MG tablet Take 650 mg by mouth daily as needed for fever or headache (pain).    [provider]  cetirizine  (ZYRTEC ) 10 MG tablet Take 1 tablet (10 mg total) by mouth daily. 01/02/18   Maczis, Rosalind Guido M, PA-C  naphazoline-pheniramine (NAPHCON-A) 0.025-0.3 % ophthalmic solution Place 1 drop into both eyes 4 (four) times daily as needed for eye  irritation. 01/02/18   Maczis, Sharper HERO, PA-C    Family History Family History  Problem Relation Age of Onset   Anesthesia problems Neg Hx    Hypotension Neg Hx    Malignant hyperthermia Neg Hx    Pseudochol deficiency Neg Hx     Social History Social History   Tobacco Use   Smoking status: Never   Smokeless tobacco: Never  Vaping Use   Vaping status: Never Used  Substance Use Topics   Alcohol use: No   Drug use: No     Allergies   Latex   Review of Systems Review of Systems  Genitourinary:  Positive for dysuria and flank pain.  All other systems reviewed and are negative.    Physical Exam Triage Vital Signs ED Triage Vitals  Encounter Vitals Group     BP      Girls Systolic BP Percentile      Girls Diastolic BP Percentile      Boys Systolic BP Percentile      Boys Diastolic BP Percentile      Pulse      Resp      Temp      Temp  src      SpO2      Weight      Height      Head Circumference      Peak Flow      Pain Score      Pain Loc      Pain Education      Exclude from Growth Chart    No data found.  Updated Vital Signs BP 121/81   Pulse 92   Temp 98.3 F (36.8 C)   Resp 16   LMP 10/08/2023   SpO2 98%    Physical Exam Vitals and nursing note reviewed.  Constitutional:      Appearance: Normal appearance. She is normal weight.  HENT:     Head: Normocephalic and atraumatic.     Mouth/Throat:     Mouth: Mucous membranes are moist.     Pharynx: Oropharynx is clear.  Eyes:     Extraocular Movements: Extraocular movements intact.     Conjunctiva/sclera: Conjunctivae normal.     Pupils: Pupils are equal, round, and reactive to light.  Cardiovascular:     Rate and Rhythm: Normal rate and regular rhythm.     Pulses: Normal pulses.     Heart sounds: Normal heart sounds.  Pulmonary:     Effort: Pulmonary effort is normal.     Breath sounds: Normal breath sounds. No wheezing, rhonchi or rales.  Musculoskeletal:        General: Normal  range of motion.  Skin:    General: Skin is warm and dry.  Neurological:     General: No focal deficit present.     Mental Status: She is alert and oriented to person, place, and time. Mental status is at baseline.  Psychiatric:        Mood and Affect: Mood normal.        Behavior: Behavior normal.      UC Treatments / Results  Labs (all labs ordered are listed, but only abnormal results are displayed) Labs Reviewed  POCT URINALYSIS DIP (MANUAL ENTRY) - Abnormal; Notable for the following components:      Result Value   Color, UA orange (*)    Clarity, UA cloudy (*)    Glucose, UA =100 (*)    Ketones, POC UA trace (5) (*)    Spec Grav, UA <=1.005 (*)    Blood, UA large (*)    Protein Ur, POC >=300 (*)    Urobilinogen, UA 2.0 (*)    Nitrite, UA Positive (*)    Leukocytes, UA Large (3+) (*)    All other components within normal limits  URINE CULTURE    EKG   Radiology No results found.  Procedures Procedures (including critical care time)  Medications Ordered in UC Medications - No data to display  Initial Impression / Assessment and Plan / UC Course  I have reviewed the triage vital signs and the nursing notes.  Pertinent labs & imaging results that were available during my care of the patient were reviewed by me and considered in my medical decision making (see chart for details).     MDM: 1.  Acute cystitis with hematuria-UA revealed above, urine culture ordered, Rx'd cefdinir 300 mg capsule: Take 1 capsule twice daily x 7 days; 2.  Strain of lumbar region, initial encounter-Rx'd ibuprofen  800 mg tablet: Take 1 tablet daily, as needed for lower back strain. Advised patient to take medication as directed with food to completion.  Advised patient may take 800  mg Advil  daily or as needed for accompanying lower back strain.  Encouraged increase daily water intake to 64 ounces per day while taking this medication.  Advised we will follow-up with urine culture results  once received.  Advised if symptoms worsen and/or unresolved please follow-up with your PCP or here for further evaluation.  Patient discharged home, hemodynamically stable. Final Clinical Impressions(s) / UC Diagnoses   Final diagnoses:  Acute cystitis with hematuria  Strain of lumbar region, initial encounter     Discharge Instructions      Advised patient to take medication as directed with food to completion.  Advised patient may take 800 mg Advil  daily or as needed for accompanying lower back strain.  Encouraged increase daily water intake to 64 ounces per day while taking this medication.  Advised we will follow-up with urine culture results once received.  Advised if symptoms worsen and/or unresolved please follow-up with your PCP or here for further evaluation.     ED Prescriptions     Medication Sig Dispense Auth. Provider   cefdinir (OMNICEF) 300 MG capsule Take 1 capsule (300 mg total) by mouth 2 (two) times daily for 7 days. 14 capsule Deshaun Schou, FNP   ibuprofen  (ADVIL ) 800 MG tablet Take 1 tablet (800 mg total) by mouth 3 (three) times daily. 21 tablet Lakeena Downie, FNP      PDMP not reviewed this encounter.   Teddy Sharper, FNP 10/25/23 1126

## 2023-10-27 ENCOUNTER — Ambulatory Visit (HOSPITAL_COMMUNITY): Payer: Self-pay

## 2023-10-27 LAB — URINE CULTURE: Culture: >=100000 — AB
